# Patient Record
Sex: Female | Born: 1935 | Race: White | Hispanic: No | Smoking: Never smoker
Health system: Southern US, Community
[De-identification: ages and names within clinical notes are randomized; demographics above are authoritative.]

## PROBLEM LIST (undated history)

## (undated) DIAGNOSIS — E7800 Pure hypercholesterolemia, unspecified: Secondary | ICD-10-CM

## (undated) DIAGNOSIS — I10 Essential (primary) hypertension: Secondary | ICD-10-CM

## (undated) DIAGNOSIS — M1990 Unspecified osteoarthritis, unspecified site: Secondary | ICD-10-CM

## (undated) DIAGNOSIS — F419 Anxiety disorder, unspecified: Secondary | ICD-10-CM

## (undated) HISTORY — PX: CATARACT EXTRACTION, BILATERAL: SHX1313

## (undated) HISTORY — PX: TONSILLECTOMY: SUR1361

## (undated) HISTORY — PX: DIAGNOSTIC LAPAROSCOPY: SUR761

## (undated) HISTORY — PX: ABDOMINAL HYSTERECTOMY: SHX81

---

## 2003-03-21 HISTORY — PX: CERVICAL FUSION: SHX112

## 2005-01-13 ENCOUNTER — Ambulatory Visit (HOSPITAL_COMMUNITY): Admission: RE | Admit: 2005-01-13 | Discharge: 2005-01-13 | Payer: Self-pay | Admitting: Neurosurgery

## 2005-01-26 ENCOUNTER — Inpatient Hospital Stay (HOSPITAL_COMMUNITY): Admission: RE | Admit: 2005-01-26 | Discharge: 2005-01-27 | Payer: Self-pay | Admitting: Neurosurgery

## 2008-10-06 ENCOUNTER — Encounter: Admission: RE | Admit: 2008-10-06 | Discharge: 2008-10-06 | Payer: Self-pay | Admitting: Family Medicine

## 2008-10-23 ENCOUNTER — Encounter: Admission: RE | Admit: 2008-10-23 | Discharge: 2008-10-23 | Payer: Self-pay | Admitting: Family Medicine

## 2009-04-16 ENCOUNTER — Ambulatory Visit (HOSPITAL_COMMUNITY): Admission: RE | Admit: 2009-04-16 | Discharge: 2009-04-16 | Payer: Self-pay | Admitting: Gastroenterology

## 2009-05-13 ENCOUNTER — Emergency Department (HOSPITAL_BASED_OUTPATIENT_CLINIC_OR_DEPARTMENT_OTHER): Admission: EM | Admit: 2009-05-13 | Discharge: 2009-05-13 | Payer: Self-pay | Admitting: Emergency Medicine

## 2009-08-09 ENCOUNTER — Emergency Department (HOSPITAL_BASED_OUTPATIENT_CLINIC_OR_DEPARTMENT_OTHER): Admission: EM | Admit: 2009-08-09 | Discharge: 2009-08-09 | Payer: Self-pay | Admitting: Emergency Medicine

## 2009-08-10 ENCOUNTER — Ambulatory Visit: Payer: Self-pay | Admitting: Diagnostic Radiology

## 2009-08-10 ENCOUNTER — Emergency Department (HOSPITAL_BASED_OUTPATIENT_CLINIC_OR_DEPARTMENT_OTHER): Admission: EM | Admit: 2009-08-10 | Discharge: 2009-08-10 | Payer: Self-pay | Admitting: Emergency Medicine

## 2009-11-09 ENCOUNTER — Encounter: Admission: RE | Admit: 2009-11-09 | Discharge: 2009-11-09 | Payer: Self-pay | Admitting: Family Medicine

## 2009-11-30 ENCOUNTER — Emergency Department (HOSPITAL_BASED_OUTPATIENT_CLINIC_OR_DEPARTMENT_OTHER): Admission: EM | Admit: 2009-11-30 | Discharge: 2009-11-30 | Payer: Self-pay | Admitting: Emergency Medicine

## 2010-04-06 IMAGING — US US CAROTID DUPLEX BILAT
1 series · 13 of 24 positions shown · non-contrast
Comparison: None

CLINICAL DATA: Carotid bruit.  Hypertension.

BILATERAL CAROTID DUPLEX ULTRASOUND
TECHNIQUE: Gray scale imaging, color Doppler and duplex ultrasound
was performed of bilateral carotid and vertebral arteries in the
neck.

[Series 1: us carotid duplex bilat · 0.05mm/px · 13 of 69 slices shown]
[im 1/69]
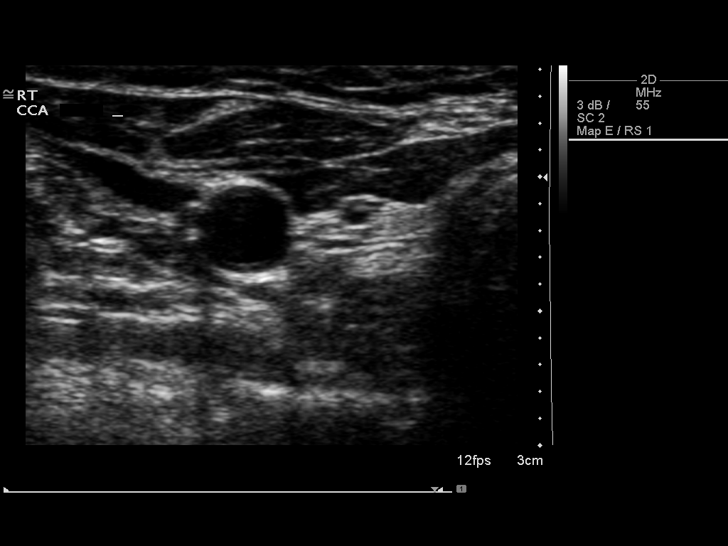
[im 6/69]
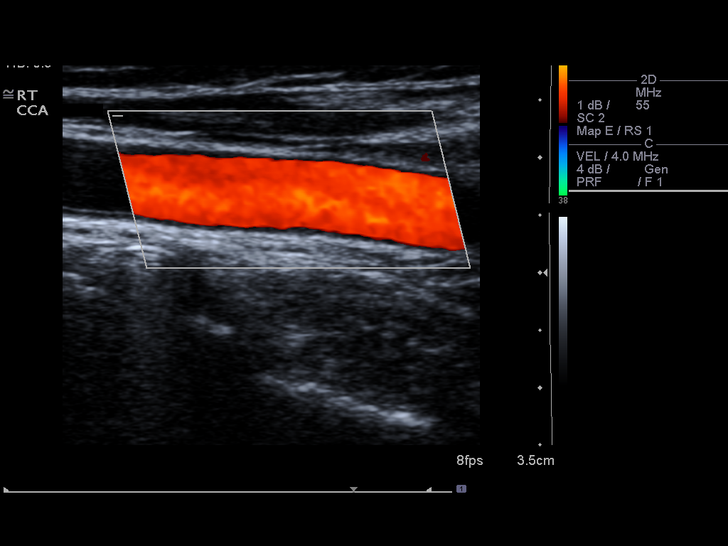
[im 12/69]
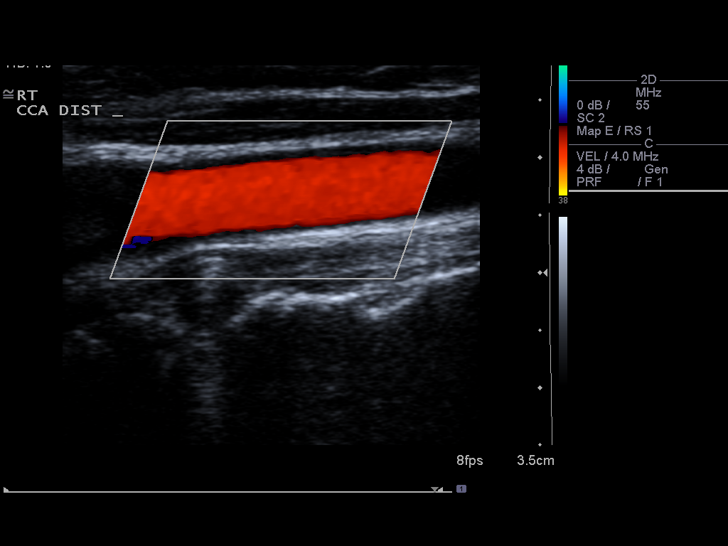
[im 18/69]
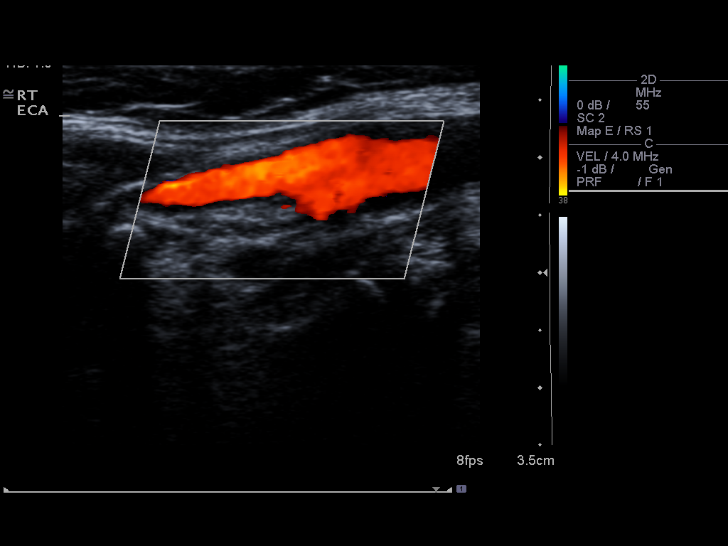
[im 24/69]
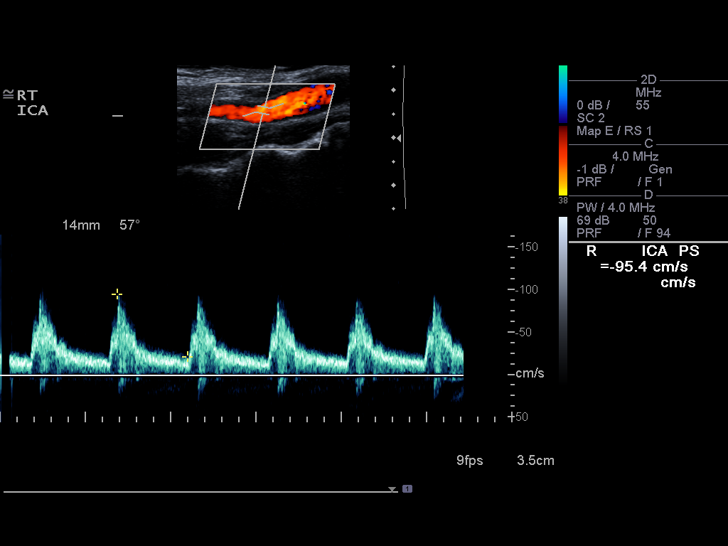
[im 30/69]
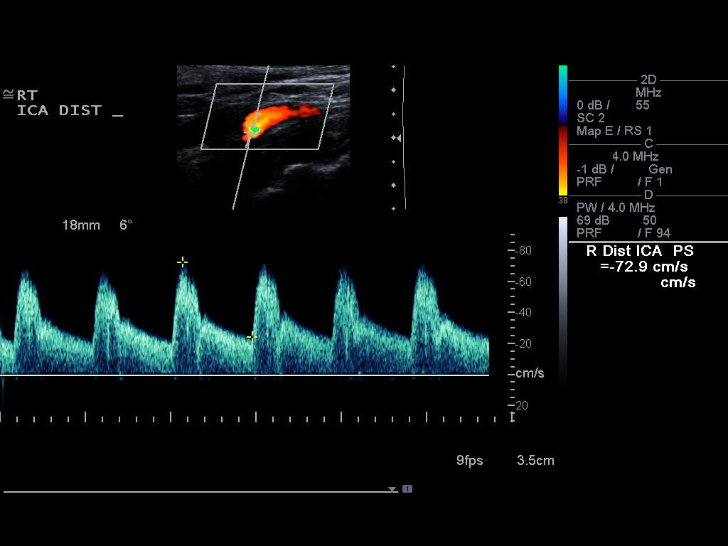
[im 36/69]
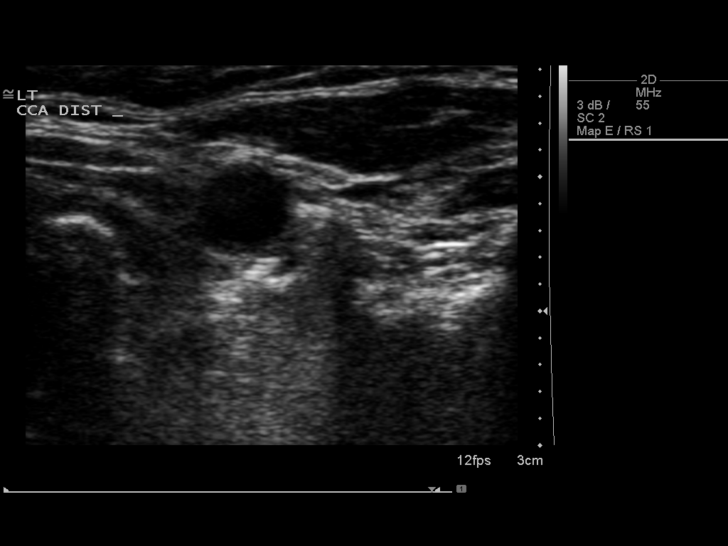
[im 39/69]
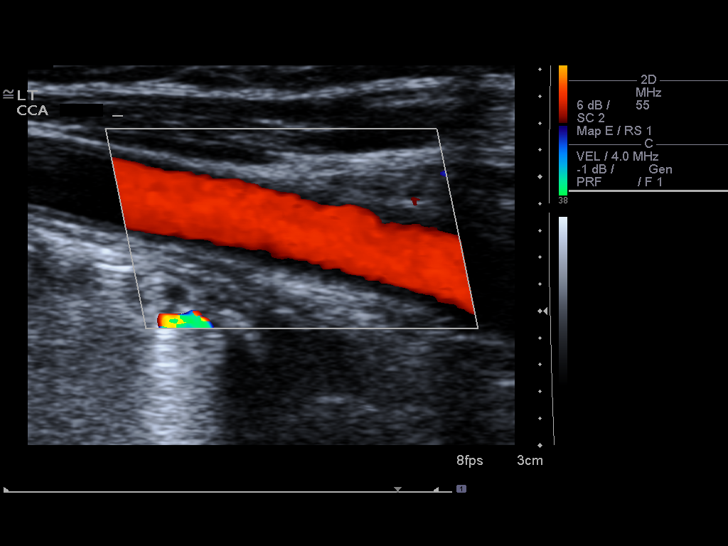
[im 45/69]
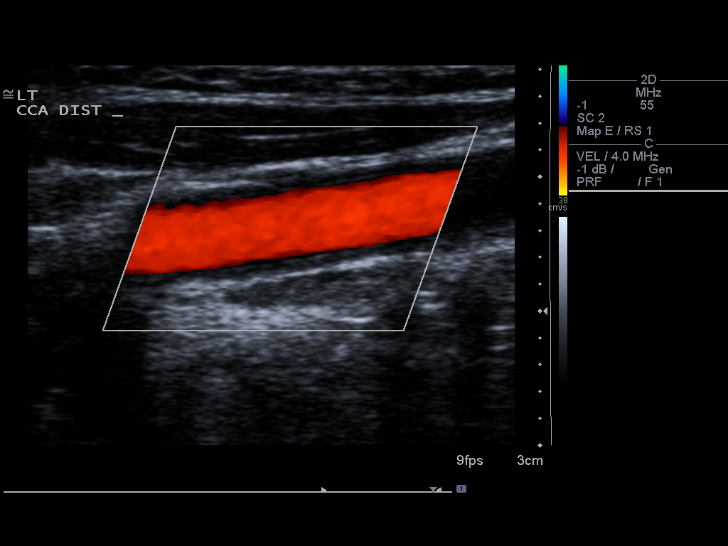
[im 51/69]
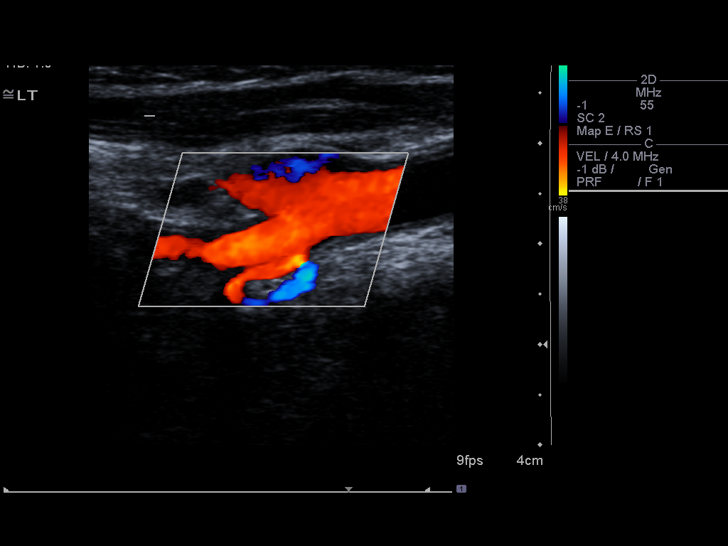
[im 57/69]
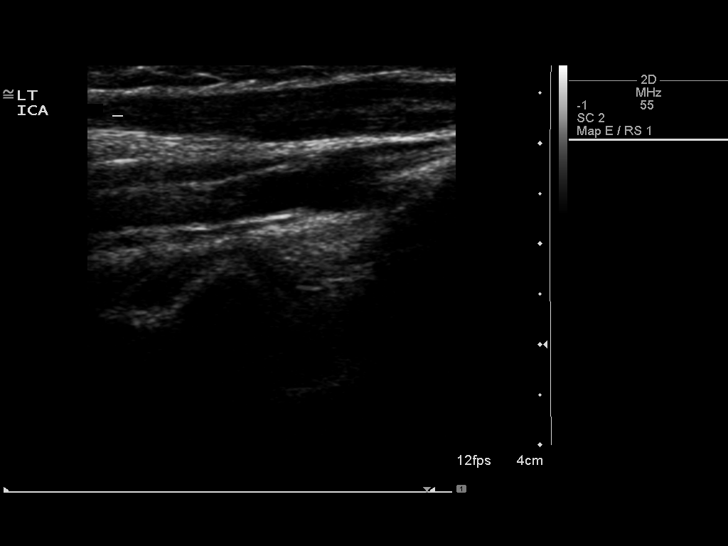
[im 63/69]
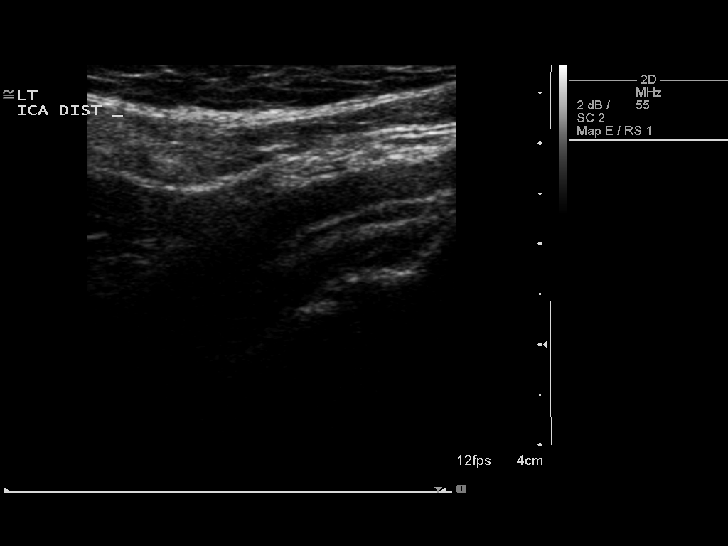
[im 69/69]
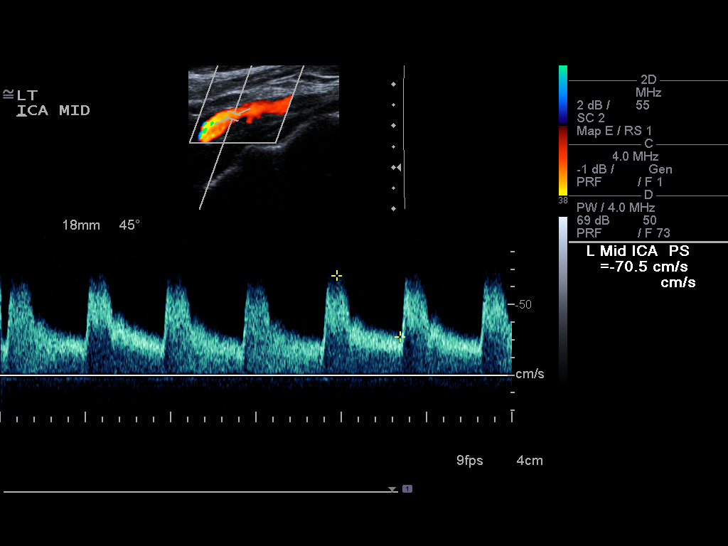

[13 of 24 positions shown; findings below may reference images not displayed]

Criteria:  Quantification of carotid stenosis is based on velocity
parameters that correlate the residual internal carotid diameter
with NASCET-based stenosis levels, using the diameter of the distal
internal carotid lumen as the denominator for stenosis measurement.

The following velocity measurements were obtained:

                 PEAK SYSTOLIC/END DIASTOLIC
RIGHT
ICA:                        95/22cm/sec
CCA:                        103/25cm/sec
SYSTOLIC ICA/CCA RATIO:
DIASTOLIC ICA/CCA RATIO:
ECA:                        105cm/sec

LEFT
ICA:                        73/27cm/sec
CCA:                        102/26cm/sec
SYSTOLIC ICA/CCA RATIO:
DIASTOLIC ICA/CCA RATIO:
ECA:                        75cm/sec
FINDINGS: RIGHT CAROTID ARTERY: There is mild intimal thickening in the bulb
without significant plaque accumulation or high-grade stenosis.
Normal wave forms with no focal aliasing on color Doppler
interrogation.

RIGHT VERTEBRAL ARTERY:  Normal flow direction and wave form.

LEFT CAROTID ARTERY: Smooth the early noncalcified plaque partially
effaces the carotid bulb.  No significant stenosis.  Normal wave
forms with no focal aliasing on color Doppler interrogation.

LEFT VERTEBRAL ARTERY:  Normal flow direction and wave form.
IMPRESSION: 1.  Early left carotid bulb plaque without significant stenosis.
The exam does not exclude plaque ulceration or embolization.
Continued surveillance recommended.

## 2010-04-09 ENCOUNTER — Encounter: Payer: Self-pay | Admitting: Neurosurgery

## 2010-04-10 ENCOUNTER — Encounter: Payer: Self-pay | Admitting: Gastroenterology

## 2010-06-02 LAB — CBC
HCT: 42.3 % (ref 36.0–46.0)
Hemoglobin: 14.1 g/dL (ref 12.0–15.0)
MCH: 31.9 pg (ref 26.0–34.0)
MCHC: 33.3 g/dL (ref 30.0–36.0)
MCV: 95.8 fL (ref 78.0–100.0)
Platelets: 235 10*3/uL (ref 150–400)
RBC: 4.41 MIL/uL (ref 3.87–5.11)
RDW: 12.5 % (ref 11.5–15.5)
WBC: 10.9 10*3/uL — ABNORMAL HIGH (ref 4.0–10.5)

## 2010-06-02 LAB — BASIC METABOLIC PANEL
BUN: 16 mg/dL (ref 6–23)
CO2: 28 mEq/L (ref 19–32)
Calcium: 9.7 mg/dL (ref 8.4–10.5)
Chloride: 103 mEq/L (ref 96–112)
Creatinine, Ser: 0.8 mg/dL (ref 0.4–1.2)
GFR calc Af Amer: >60 mL/min (ref >60)
GFR calc non Af Amer: >60 mL/min (ref >60)
Glucose, Bld: 87 mg/dL (ref 70–99)
Potassium: 4.5 mEq/L (ref 3.5–5.1)
Sodium: 143 mEq/L (ref 135–145)

## 2010-06-02 LAB — URINE MICROSCOPIC-ADD ON

## 2010-06-02 LAB — POCT CARDIAC MARKERS
CKMB, poc: <1 ng/mL — ABNORMAL LOW (ref 1.0–8.0)
Myoglobin, poc: 75.9 ng/mL (ref 12–200)
Troponin i, poc: <0.05 ng/mL (ref 0.00–0.09)

## 2010-06-02 LAB — URINALYSIS, ROUTINE W REFLEX MICROSCOPIC
Bilirubin Urine: NEGATIVE
Glucose, UA: NEGATIVE mg/dL
Hgb urine dipstick: NEGATIVE
Ketones, ur: NEGATIVE mg/dL
Nitrite: NEGATIVE
Protein, ur: NEGATIVE mg/dL
Specific Gravity, Urine: 1.01 (ref 1.005–1.030)
Urobilinogen, UA: 0.2 mg/dL (ref 0.0–1.0)
pH: 5.5 (ref 5.0–8.0)

## 2010-06-06 LAB — URINE CULTURE

## 2010-06-06 LAB — DIFFERENTIAL
Basophils Absolute: 0 10*3/uL (ref 0.0–0.1)
Basophils Relative: 0 % (ref 0–1)
Eosinophils Absolute: 0 10*3/uL (ref 0.0–0.7)
Eosinophils Relative: 0 % (ref 0–5)
Lymphocytes Relative: 16 % (ref 12–46)
Lymphs Abs: 1.2 10*3/uL (ref 0.7–4.0)
Monocytes Absolute: 0.5 10*3/uL (ref 0.1–1.0)
Monocytes Relative: 6 % (ref 3–12)
Neutro Abs: 5.8 10*3/uL (ref 1.7–7.7)
Neutrophils Relative %: 78 % — ABNORMAL HIGH (ref 43–77)

## 2010-06-06 LAB — COMPREHENSIVE METABOLIC PANEL
ALT: 34 U/L (ref 0–35)
AST: 30 U/L (ref 0–37)
Albumin: 3.9 g/dL (ref 3.5–5.2)
Alkaline Phosphatase: 53 U/L (ref 39–117)
BUN: 9 mg/dL (ref 6–23)
CO2: 28 mEq/L (ref 19–32)
Calcium: 9.1 mg/dL (ref 8.4–10.5)
Chloride: 105 mEq/L (ref 96–112)
Creatinine, Ser: 0.8 mg/dL (ref 0.4–1.2)
GFR calc Af Amer: >60 mL/min (ref >60)
GFR calc non Af Amer: >60 mL/min (ref >60)
Glucose, Bld: 106 mg/dL — ABNORMAL HIGH (ref 70–99)
Potassium: 4.1 mEq/L (ref 3.5–5.1)
Sodium: 143 mEq/L (ref 135–145)
Total Bilirubin: 0.7 mg/dL (ref 0.3–1.2)
Total Protein: 6.9 g/dL (ref 6.0–8.3)

## 2010-06-06 LAB — CBC
HCT: 37.8 % (ref 36.0–46.0)
Hemoglobin: 12.8 g/dL (ref 12.0–15.0)
MCHC: 33.8 g/dL (ref 30.0–36.0)
MCV: 96.2 fL (ref 78.0–100.0)
Platelets: 227 10*3/uL (ref 150–400)
RBC: 3.93 MIL/uL (ref 3.87–5.11)
RDW: 12.2 % (ref 11.5–15.5)
WBC: 7.5 10*3/uL (ref 4.0–10.5)

## 2010-06-06 LAB — URINALYSIS, ROUTINE W REFLEX MICROSCOPIC
Bilirubin Urine: NEGATIVE
Bilirubin Urine: NEGATIVE
Glucose, UA: NEGATIVE mg/dL
Glucose, UA: NEGATIVE mg/dL
Hgb urine dipstick: NEGATIVE
Hgb urine dipstick: NEGATIVE
Ketones, ur: NEGATIVE mg/dL
Ketones, ur: NEGATIVE mg/dL
Nitrite: NEGATIVE
Nitrite: NEGATIVE
Protein, ur: NEGATIVE mg/dL
Protein, ur: NEGATIVE mg/dL
Specific Gravity, Urine: 1.009 (ref 1.005–1.030)
Specific Gravity, Urine: 1.014 (ref 1.005–1.030)
Urobilinogen, UA: 0.2 mg/dL (ref 0.0–1.0)
Urobilinogen, UA: 0.2 mg/dL (ref 0.0–1.0)
pH: 5.5 (ref 5.0–8.0)
pH: 5.5 (ref 5.0–8.0)

## 2010-06-06 LAB — LIPASE, BLOOD: Lipase: 73 U/L (ref 23–300)

## 2010-06-06 LAB — URINE MICROSCOPIC-ADD ON

## 2010-06-10 LAB — CBC
HCT: 39.3 % (ref 36.0–46.0)
MCV: 94.8 fL (ref 78.0–100.0)
Platelets: 268 10*3/uL (ref 150–400)
RDW: 11.1 % — ABNORMAL LOW (ref 11.5–15.5)

## 2010-06-10 LAB — URINALYSIS, ROUTINE W REFLEX MICROSCOPIC
Ketones, ur: NEGATIVE mg/dL
Nitrite: NEGATIVE
Protein, ur: NEGATIVE mg/dL

## 2010-06-10 LAB — BASIC METABOLIC PANEL
BUN: 6 mg/dL (ref 6–23)
Chloride: 91 mEq/L — ABNORMAL LOW (ref 96–112)
GFR calc non Af Amer: >60 mL/min (ref >60)
Glucose, Bld: 84 mg/dL (ref 70–99)
Potassium: 3.7 mEq/L (ref 3.5–5.1)

## 2010-06-10 LAB — DIFFERENTIAL
Basophils Absolute: 0 10*3/uL (ref 0.0–0.1)
Eosinophils Absolute: 0 10*3/uL (ref 0.0–0.7)
Eosinophils Relative: 0 % (ref 0–5)

## 2010-08-05 NOTE — Op Note (Signed)
NAMEKENDELL, GAMMON                 ACCOUNT NO.:  [id number]   MEDICAL RECORD NO.:  [id number]          PATIENT TYPE:  OIB   LOCATION:  3013                         FACILITY:  [facility]   PHYSICIAN:  Hewitt Shorts, M.D.DATE OF BIRTH:  1934-05-20   DATE OF PROCEDURE:  01/26/2005  DATE OF DISCHARGE:                                 OPERATIVE REPORT   PREOPERATIVE DIAGNOSES:  1.  C5-6, C6-7 cervical disk herniation.  2.  Cervical degenerative disk disease.  3.  Cervical spondylosis.  4.  Cervical radiculopathy.   POSTOPERATIVE DIAGNOSES:  1.  C5-6, C6-7 cervical disk herniation.  2.  Cervical degenerative disk disease.  3.  Cervical spondylosis.  4.  Cervical radiculopathy.   OPERATION/PROCEDURE:  C5-6, C6-7 anterior cervical diskectomy with  arthrodesis with allograft and Tether cervical plating.   SURGEON:  Hewitt Shorts, M.D.   ASSISTANT:  Cristi Loron, M.D.   ANESTHESIA:  General endotracheal anesthesia.   INDICATIONS:  The patient is 76 year old woman who presented with an acute  right cervical radiculopathy.  Was found to be due to a large right C6-7  cervical disk connection.  However, she had significant spondylitic disk  herniation and degenerative disk disease at the C5-6 and C6-7 levels, and  thus, we may proceed with a two-level anterior cervical diskectomy with  arthrodesis.   DESCRIPTION OF PROCEDURE:  The patient was brought to the operating room and  placed under general endotracheal anesthesia.  The patient was placed in 10  pounds of halter traction.  Neck was prepped with Betadine soap and  solution, draped in the sterile fashion.  A horizontal incision was made in  the left side of the neck.  The line of the incision was infiltrated with  local anesthetic with epinephrine.  The incision was carried down through  the subcutaneous tissue and platysma.  Bipolar cautery and electrocautery  was used to maintain hemostasis.  The incision was carried  down through an  avascular plane, leaving the sternocleidomastoid, carotid artery, and  jugular vein laterally with the trachea and esophagus medially.  The ventral  aspect and the vertebral column identified and a localizing x-ray taken, and  the C5-6 and C6-7 intervertebral disk space was identified.  Diskectomy was  begun with an incision at the annulus at each level using continuous  microcurets and pituitary rongeurs.  Anterior osteophytic overgrowth was  removed, and the microscope was draped and brought into the field to provide  additional magnification, illumination, and visualization.  The remainder of  the decompression was performed using microdissection and microsurgical  technique.  The cauterized end plates and the corresponding vertebrae were  removed using the microcurets and X-Max drill.  Posterior osseous overgrowth  was removed using the X-Max drill and a 2 mm Kerrison punch with a thin foot  plate.  There was significant spondylytic degenerative changes of the  posterior longitudinal ligament, which was carefully removed, and as we  dissected laterally towards the right C6-7 neural foramen, several fragments  of disk herniation were exposed, freed up from the surrounding epidural  tissues  and removed.  Decompression the lateral thecal sac and exiting right  C7 nerve root.  In the end, good decompression was achieved at the spinal  canal and thecal sac as well as the neural foramen and nerve roots  bilaterally at each level.  Once the decompression was completed, hemostasis  was established with the use of Gelfoam soaked in thrombin and then we  measured the height of each intervertebral disk space and selected two 6 mm  in height cortical allografts.  The center of each was filled with DBX putty  and each graft was implanted in the intervertebral disk space and  countersunk.  We then discontinued the cervical traction and selected a 32  mm Tether cervical plate.  This  was positioned over the fusion construct.  It was secured to the vertebra with 4 x 14 mm screws, placing two screws at  C5, two screws at C7 and a single screw at C6.  The screw hole was drilled  and tapped.  The screws were placed in an alternating fashion.  Final  tightening was that of all five screws.  X-ray afterwards showed implants  including screws, plate and bone grafts in good position.  The overall  alignment was good.  The wound was irrigated with bacitracin solution and  checked for hemostasis, established and confirmed.  Then, we proceeded with  closure of the platysma and closed with interrupted, inverted 2-0 Vicryl  sutures; the subcutaneous and subcuticular were closed with interrupted,  inverted 3-0 Vicryl; and the skin was reapproximated with Dermabond.  The  procedure was tolerated well.  Estimated blood loss was 35 mL.  Sponge count  correct.  Following surgery, the patient was placed in a soft cervical  collar to be reversed, extubated and transferred to the recovery room for  further care.      Hewitt Shorts, M.D.  Electronically Signed     RWN/MEDQ  D:  01/26/2005  T:  01/27/2005  Job:  [id number]

## 2010-10-27 ENCOUNTER — Emergency Department (HOSPITAL_BASED_OUTPATIENT_CLINIC_OR_DEPARTMENT_OTHER)
Admission: EM | Admit: 2010-10-27 | Discharge: 2010-10-28 | Disposition: A | Discharge status: Home / Self Care | Payer: Medicare Other | Attending: Emergency Medicine | Admitting: Emergency Medicine

## 2010-10-27 DIAGNOSIS — K5289 Other specified noninfective gastroenteritis and colitis: Secondary | ICD-10-CM | POA: Insufficient documentation

## 2010-10-27 DIAGNOSIS — R197 Diarrhea, unspecified: Secondary | ICD-10-CM | POA: Insufficient documentation

## 2010-10-27 DIAGNOSIS — I10 Essential (primary) hypertension: Secondary | ICD-10-CM | POA: Insufficient documentation

## 2010-10-27 DIAGNOSIS — E7800 Pure hypercholesterolemia, unspecified: Secondary | ICD-10-CM | POA: Insufficient documentation

## 2010-10-27 HISTORY — DX: Essential (primary) hypertension: I10

## 2010-10-27 HISTORY — DX: Pure hypercholesterolemia, unspecified: E78.00

## 2010-10-27 NOTE — ED Notes (Signed)
Diarrhea, sweats, nausea since 5pm

## 2010-10-28 LAB — URINALYSIS, ROUTINE W REFLEX MICROSCOPIC
Bilirubin Urine: NEGATIVE
Ketones, ur: NEGATIVE mg/dL
Nitrite: NEGATIVE
Urobilinogen, UA: 0.2 mg/dL (ref 0.0–1.0)

## 2010-10-28 LAB — URINE MICROSCOPIC-ADD ON

## 2010-10-28 MED ORDER — SODIUM CHLORIDE 0.9 % IV BOLUS (SEPSIS)
1000.0000 mL | Freq: Once | INTRAVENOUS | Status: AC
  Administered 2010-10-28: 1000 mL via INTRAVENOUS

## 2010-10-28 MED ORDER — ONDANSETRON HCL 4 MG/2ML IJ SOLN
4.0000 mg | Freq: Once | INTRAMUSCULAR | Status: AC
  Administered 2010-10-28: 4 mg via INTRAVENOUS
  Filled 2010-10-28: qty 2

## 2010-10-28 MED ORDER — ONDANSETRON HCL 4 MG PO TABS: 4.0000 mg | ORAL_TABLET | Freq: Three times a day (TID) | ORAL | Status: AC | PRN

## 2010-10-28 NOTE — ED Provider Notes (Signed)
History     CSN: [id number] Arrival date & time: 10/27/2010 10:37 PM  Chief Complaint  Patient presents with  . Diarrhea   HPI This is a 76 year old female with a history of nausea and diarrhea that began yesterday afternoon about 5:30. She estimates she's had 6 diarrheal stools. She has not had vomiting. She states she feels like she is dehydrated and her mouth is dry. She has taken Imodium with improvement in the diarrhea. The nausea persists and is or principal concern at this time. There's been no associated chest pain dyspnea or abdominal pain although she has had some abdominal cramping. She denies dysuria.  Past Medical History  Diagnosis Date  . High cholesterol   . Hypertension     Past Surgical History  Procedure Date  . Abdominal hysterectomy     No family history on file.  History  Substance Use Topics  . Smoking status: Never Smoker   . Smokeless tobacco: Not on file  . Alcohol Use: Yes    OB History    Grav Para Term Preterm Abortions TAB SAB Ect Mult Living                  Review of Systems  All other systems reviewed and are negative.    Physical Exam  BP 121/62  Pulse 75  Temp(Src) 97.6 F (36.4 C) (Oral)  Resp 16  Ht 5\' 3"  (1.6 m)  Wt 112 lb (50.803 kg)  BMI 19.84 kg/m2  SpO2 94%  Physical Exam General: Thin white female in no acute distress; appearance consistent with age of record HENT: normocephalic, atraumatic Eyes: pupils equal round and reactive to light; extraocular muscles intact Neck: supple Heart: regular rate and rhythm; no murmurs, rubs or gallops Lungs: clear to auscultation bilaterally Abdomen: soft; nontender; nondistended; no masses or hepatosplenomegaly; bowel sounds present Extremities: No deformity; full range of motion; pulses normal Neurologic: Awake, alert and oriented;motor function intact in all extremities and symmetric;sensation grossly intact; no facial droop Skin: Warm and dry Psychiatric: Normal mood and  affect   ED Course  Procedures  MDM Nursing notes and vitals signs, including pulse oximetry, reviewed.  Summary of this visit's results, reviewed by myself:  Labs:  Results for orders placed during the hospital encounter of 10/27/10  URINALYSIS, ROUTINE W REFLEX MICROSCOPIC      Component Value Range   Color, Urine YELLOW  YELLOW    Appearance CLEAR  CLEAR    Specific Gravity, Urine 1.021  1.005 - 1.030    pH 5.0  5.0 - 8.0    Glucose, UA NEGATIVE  NEGATIVE (mg/dL)   Hgb urine dipstick TRACE (*) NEGATIVE    Bilirubin Urine NEGATIVE  NEGATIVE    Ketones, ur NEGATIVE  NEGATIVE (mg/dL)   Protein, ur NEGATIVE  NEGATIVE (mg/dL)   Urobilinogen, UA 0.2  0.0 - 1.0 (mg/dL)   Nitrite NEGATIVE  NEGATIVE    Leukocytes, UA TRACE (*) NEGATIVE   URINE MICROSCOPIC-ADD ON      Component Value Range   Squamous Epithelial / LPF FEW (*) RARE    WBC, UA 3-6  <3 (WBC/hpf)   RBC / HPF 3-6  <3 (RBC/hpf)   Casts HYALINE CASTS (*) NEGATIVE     Imaging Studies: No results found.  2:23 AM Patient feels better after IV fluid bolus and Zofran. She states her nausea is relieved. She was advised to continue the Imodium for diarrhea as needed per package instructions.  Hanley Seamen, MD 10/28/10 [phone]

## 2010-10-28 NOTE — ED Notes (Signed)
Pt lying in bed sleeping but rouses to voice. Pt is in NAD. Pt has had no episodes of diarrhea while in the department but continues to c/o of nausea. Pt denies needs and verbalizes understanding of wait to see provider.

## 2010-11-22 ENCOUNTER — Other Ambulatory Visit: Payer: Self-pay | Admitting: Family Medicine

## 2010-11-22 DIAGNOSIS — Z1231 Encounter for screening mammogram for malignant neoplasm of breast: Secondary | ICD-10-CM

## 2010-12-14 ENCOUNTER — Ambulatory Visit: Payer: Medicare Other

## 2010-12-30 ENCOUNTER — Ambulatory Visit
Admission: RE | Admit: 2010-12-30 | Discharge: 2010-12-30 | Disposition: A | Discharge status: Home / Self Care | Payer: Medicare Other | Source: Ambulatory Visit | Attending: Family Medicine | Admitting: Family Medicine

## 2010-12-30 DIAGNOSIS — Z1231 Encounter for screening mammogram for malignant neoplasm of breast: Secondary | ICD-10-CM

## 2011-04-18 ENCOUNTER — Other Ambulatory Visit: Payer: Self-pay | Admitting: Orthopedic Surgery

## 2011-04-20 ENCOUNTER — Other Ambulatory Visit: Payer: Self-pay

## 2011-04-20 ENCOUNTER — Encounter (HOSPITAL_BASED_OUTPATIENT_CLINIC_OR_DEPARTMENT_OTHER)
Admission: RE | Admit: 2011-04-20 | Discharge: 2011-04-20 | Disposition: A | Discharge status: Home / Self Care | Payer: Medicare Other | Source: Ambulatory Visit | Attending: Orthopedic Surgery | Admitting: Orthopedic Surgery

## 2011-04-20 ENCOUNTER — Encounter (HOSPITAL_BASED_OUTPATIENT_CLINIC_OR_DEPARTMENT_OTHER): Payer: Self-pay | Admitting: *Deleted

## 2011-04-20 LAB — BASIC METABOLIC PANEL
BUN: 20 mg/dL (ref 6–23)
Chloride: 101 mEq/L (ref 96–112)
GFR calc Af Amer: 81 mL/min — ABNORMAL LOW (ref >90)
Glucose, Bld: 88 mg/dL (ref 70–99)
Potassium: 4.2 mEq/L (ref 3.5–5.1)

## 2011-04-20 NOTE — Progress Notes (Signed)
To come in for [id number]  

## 2011-04-25 ENCOUNTER — Ambulatory Visit (HOSPITAL_BASED_OUTPATIENT_CLINIC_OR_DEPARTMENT_OTHER)
Admission: RE | Admit: 2011-04-25 | Discharge: 2011-04-25 | Disposition: A | Discharge status: Home / Self Care | Payer: Medicare Other | Source: Ambulatory Visit | Attending: Orthopedic Surgery | Admitting: Orthopedic Surgery

## 2011-04-25 ENCOUNTER — Encounter (HOSPITAL_BASED_OUTPATIENT_CLINIC_OR_DEPARTMENT_OTHER): Payer: Self-pay | Admitting: *Deleted

## 2011-04-25 ENCOUNTER — Encounter (HOSPITAL_BASED_OUTPATIENT_CLINIC_OR_DEPARTMENT_OTHER): Payer: Self-pay | Admitting: Orthopedic Surgery

## 2011-04-25 ENCOUNTER — Encounter (HOSPITAL_BASED_OUTPATIENT_CLINIC_OR_DEPARTMENT_OTHER): Payer: Self-pay | Admitting: Certified Registered Nurse Anesthetist

## 2011-04-25 ENCOUNTER — Encounter (HOSPITAL_BASED_OUTPATIENT_CLINIC_OR_DEPARTMENT_OTHER)
Admission: RE | Disposition: A | Discharge status: Home / Self Care | Payer: Self-pay | Source: Ambulatory Visit | Attending: Orthopedic Surgery

## 2011-04-25 DIAGNOSIS — Z01810 Encounter for preprocedural cardiovascular examination: Secondary | ICD-10-CM | POA: Insufficient documentation

## 2011-04-25 DIAGNOSIS — M19049 Primary osteoarthritis, unspecified hand: Secondary | ICD-10-CM | POA: Insufficient documentation

## 2011-04-25 DIAGNOSIS — Z01812 Encounter for preprocedural laboratory examination: Secondary | ICD-10-CM | POA: Insufficient documentation

## 2011-04-25 DIAGNOSIS — E7800 Pure hypercholesterolemia, unspecified: Secondary | ICD-10-CM | POA: Insufficient documentation

## 2011-04-25 DIAGNOSIS — I10 Essential (primary) hypertension: Secondary | ICD-10-CM | POA: Insufficient documentation

## 2011-04-25 HISTORY — DX: Unspecified osteoarthritis, unspecified site: M19.90

## 2011-04-25 HISTORY — DX: Anxiety disorder, unspecified: F41.9

## 2011-04-25 HISTORY — PX: FINGER ARTHRODESIS: SHX5000

## 2011-04-25 LAB — POCT HEMOGLOBIN-HEMACUE: Hemoglobin: 13.1 g/dL (ref 12.0–15.0)

## 2011-04-25 SURGERY — FUSION, JOINT, FINGER
Anesthesia: General | Site: Finger | Laterality: Right | Wound class: Clean

## 2011-04-25 MED ORDER — PROMETHAZINE HCL 25 MG/ML IJ SOLN: 6.2500 mg | INTRAMUSCULAR | Status: DC | PRN

## 2011-04-25 MED ORDER — OXYCODONE-ACETAMINOPHEN 5-325 MG PO TABS: 1.0000 | ORAL_TABLET | ORAL | Status: AC | PRN

## 2011-04-25 MED ORDER — LIDOCAINE HCL (CARDIAC) 20 MG/ML IV SOLN
INTRAVENOUS | Status: DC | PRN
  Administered 2011-04-25: 60 mg via INTRAVENOUS

## 2011-04-25 MED ORDER — CHLORHEXIDINE GLUCONATE 4 % EX LIQD: 60.0000 mL | Freq: Once | CUTANEOUS | Status: DC

## 2011-04-25 MED ORDER — CEFAZOLIN SODIUM 1-5 GM-% IV SOLN
INTRAVENOUS | Status: DC | PRN
  Administered 2011-04-25: 1 g via INTRAVENOUS

## 2011-04-25 MED ORDER — PROPOFOL 10 MG/ML IV EMUL
INTRAVENOUS | Status: DC | PRN
  Administered 2011-04-25: 100 mg via INTRAVENOUS

## 2011-04-25 MED ORDER — CEFAZOLIN SODIUM 1-5 GM-% IV SOLN: 1.0000 g | Freq: Once | INTRAVENOUS | Status: DC

## 2011-04-25 MED ORDER — EPHEDRINE SULFATE 50 MG/ML IJ SOLN
INTRAMUSCULAR | Status: DC | PRN
  Administered 2011-04-25: 10 mg via INTRAVENOUS

## 2011-04-25 MED ORDER — DEXAMETHASONE SODIUM PHOSPHATE 10 MG/ML IJ SOLN
INTRAMUSCULAR | Status: DC | PRN
  Administered 2011-04-25: 10 mg via INTRAVENOUS

## 2011-04-25 MED ORDER — LACTATED RINGERS IV SOLN
INTRAVENOUS | Status: DC
  Administered 2011-04-25 (×2): via INTRAVENOUS

## 2011-04-25 MED ORDER — FENTANYL CITRATE 0.05 MG/ML IJ SOLN
INTRAMUSCULAR | Status: DC | PRN
  Administered 2011-04-25: 25 ug via INTRAVENOUS
  Administered 2011-04-25: 50 ug via INTRAVENOUS

## 2011-04-25 MED ORDER — LIDOCAINE HCL 2 % IJ SOLN
INTRAMUSCULAR | Status: DC | PRN
  Administered 2011-04-25: 4.5 mL

## 2011-04-25 MED ORDER — CEPHALEXIN 500 MG PO CAPS: 500.0000 mg | ORAL_CAPSULE | Freq: Three times a day (TID) | ORAL | Status: AC

## 2011-04-25 MED ORDER — ONDANSETRON HCL 4 MG/2ML IJ SOLN
INTRAMUSCULAR | Status: DC | PRN
  Administered 2011-04-25: 4 mg via INTRAVENOUS

## 2011-04-25 MED ORDER — MEPERIDINE HCL 25 MG/ML IJ SOLN: 6.2500 mg | INTRAMUSCULAR | Status: DC | PRN

## 2011-04-25 MED ORDER — FENTANYL CITRATE 0.05 MG/ML IJ SOLN: 25.0000 ug | INTRAMUSCULAR | Status: DC | PRN

## 2011-04-25 SURGICAL SUPPLY — 78 items
BANDAGE ADHESIVE 1X3 (GAUZE/BANDAGES/DRESSINGS) IMPLANT
BANDAGE CONFORM 3  STR LF (GAUZE/BANDAGES/DRESSINGS) IMPLANT
BANDAGE ELASTIC 3 VELCRO ST LF (GAUZE/BANDAGES/DRESSINGS) ×1 IMPLANT
BANDAGE GAUZE ELAST BULKY 4 IN (GAUZE/BANDAGES/DRESSINGS) IMPLANT
BIT DRILL MICR ACTRK 2 LNG PRF (BIT) IMPLANT
BLADE MINI RND TIP GREEN BEAV (BLADE) ×1 IMPLANT
BLADE SURG 15 STRL LF DISP TIS (BLADE) ×1 IMPLANT
BLADE SURG 15 STRL SS (BLADE) ×2
BNDG CMPR 9X4 STRL LF SNTH (GAUZE/BANDAGES/DRESSINGS) ×1
BNDG CMPR MD 5X2 ELC HKLP STRL (GAUZE/BANDAGES/DRESSINGS)
BNDG COHESIVE 1X5 TAN STRL LF (GAUZE/BANDAGES/DRESSINGS) ×1 IMPLANT
BNDG ELASTIC 2 VLCR STRL LF (GAUZE/BANDAGES/DRESSINGS) IMPLANT
BNDG ESMARK 4X9 LF (GAUZE/BANDAGES/DRESSINGS) ×2 IMPLANT
BRUSH SCRUB EZ PLAIN DRY (MISCELLANEOUS) ×2 IMPLANT
BUR EGG/OVAL CARBIDE (BURR) ×1 IMPLANT
BUR FAST CUTTING MED (BURR) IMPLANT
CLOTH BEACON ORANGE TIMEOUT ST (SAFETY) ×2 IMPLANT
CORDS BIPOLAR (ELECTRODE) ×2 IMPLANT
COVER MAYO STAND STRL (DRAPES) ×2 IMPLANT
COVER TABLE BACK 60X90 (DRAPES) ×2 IMPLANT
CUFF TOURNIQUET SINGLE 18IN (TOURNIQUET CUFF) ×1 IMPLANT
DECANTER SPIKE VIAL GLASS SM (MISCELLANEOUS) ×1 IMPLANT
DRAPE EXTREMITY T 121X128X90 (DRAPE) ×2 IMPLANT
DRAPE OEC MINIVIEW 54X84 (DRAPES) ×2 IMPLANT
DRAPE SURG 17X23 STRL (DRAPES) ×2 IMPLANT
DRILL MICRO ACUTRAK 2 LNG PROF (BIT) ×2
DRSG TEGADERM 4X4.75 (GAUZE/BANDAGES/DRESSINGS) IMPLANT
GAUZE XEROFORM 1X8 LF (GAUZE/BANDAGES/DRESSINGS) IMPLANT
GLOVE BIO SURGEON STRL SZ7 (GLOVE) ×1 IMPLANT
GLOVE BIOGEL M STRL SZ7.5 (GLOVE) ×2 IMPLANT
GLOVE BIOGEL PI IND STRL 7.0 (GLOVE) IMPLANT
GLOVE BIOGEL PI IND STRL 8 (GLOVE) ×1 IMPLANT
GLOVE BIOGEL PI INDICATOR 7.0 (GLOVE) ×2
GLOVE BIOGEL PI INDICATOR 8 (GLOVE) ×1
GLOVE ORTHO TXT STRL SZ7.5 (GLOVE) ×2 IMPLANT
GOWN PREVENTION PLUS XLARGE (GOWN DISPOSABLE) ×2 IMPLANT
GOWN PREVENTION PLUS XXLARGE (GOWN DISPOSABLE) ×4 IMPLANT
K-WIRE 4.0X.028 (WIRE) IMPLANT
KWIRE 4.0 X .035IN (WIRE) ×2 IMPLANT
LOOP VESSEL MAXI BLUE (MISCELLANEOUS) IMPLANT
NDL HYPO 25X1 1.5 SAFETY (NEEDLE) IMPLANT
NEEDLE 27GAX1X1/2 (NEEDLE) ×1 IMPLANT
NEEDLE HYPO 25X1 1.5 SAFETY (NEEDLE) IMPLANT
NS IRRIG 1000ML POUR BTL (IV SOLUTION) ×2 IMPLANT
PACK BASIN DAY SURGERY FS (CUSTOM PROCEDURE TRAY) ×2 IMPLANT
PAD CAST 3X4 CTTN HI CHSV (CAST SUPPLIES) IMPLANT
PADDING CAST ABS 4INX4YD NS (CAST SUPPLIES) ×1
PADDING CAST ABS COTTON 4X4 ST (CAST SUPPLIES) ×1 IMPLANT
PADDING CAST COTTON 3X4 STRL (CAST SUPPLIES) ×2
PADDING UNDERCAST 2  STERILE (CAST SUPPLIES) ×2 IMPLANT
SCREW ACUTRAK 2 MICRO 20MM (Screw) ×1 IMPLANT
SLEEVE SCD COMPRESS KNEE MED (MISCELLANEOUS) ×1 IMPLANT
SPLINT FNGR BALL END 5/8X4.25 (SOFTGOODS) IMPLANT
SPLINT PLASTALUME BALL 4 1/4IN (SOFTGOODS) ×2
SPLINT PLASTER CAST XFAST 3X15 (CAST SUPPLIES) IMPLANT
SPLINT PLASTER XTRA FASTSET 3X (CAST SUPPLIES)
SPONGE GAUZE 4X4 12PLY (GAUZE/BANDAGES/DRESSINGS) ×2 IMPLANT
STOCKINETTE 4X48 STRL (DRAPES) ×2 IMPLANT
STRIP CLOSURE SKIN 1/2X4 (GAUZE/BANDAGES/DRESSINGS) IMPLANT
SUT ETHILON 5 0 P 3 18 (SUTURE)
SUT FIBERWIRE 3-0 18 TAPR NDL (SUTURE)
SUT MERSILENE 4 0 P 3 (SUTURE) ×1 IMPLANT
SUT NYLON ETHILON 5-0 P-3 1X18 (SUTURE) IMPLANT
SUT PROLENE 3 0 PS 2 (SUTURE) ×1 IMPLANT
SUT PROLENE 4 0 P 3 18 (SUTURE) ×1 IMPLANT
SUT STEEL 0 (SUTURE)
SUT STEEL 0 18XMFL TIE 17 (SUTURE) IMPLANT
SUT VIC AB 4-0 P-3 18XBRD (SUTURE) IMPLANT
SUT VIC AB 4-0 P3 18 (SUTURE)
SUTURE FIBERWR 3-0 18 TAPR NDL (SUTURE) IMPLANT
SYR 3ML 23GX1 SAFETY (SYRINGE) IMPLANT
SYR BULB 3OZ (MISCELLANEOUS) ×2 IMPLANT
SYR CONTROL 10ML LL (SYRINGE) ×2 IMPLANT
TOWEL OR 17X24 6PK STRL BLUE (TOWEL DISPOSABLE) ×3 IMPLANT
TOWEL OR NON WOVEN STRL DISP B (DISPOSABLE) ×1 IMPLANT
TRAY DSU PREP LF (CUSTOM PROCEDURE TRAY) ×2 IMPLANT
UNDERPAD 30X30 INCONTINENT (UNDERPADS AND DIAPERS) ×2 IMPLANT
WATER STERILE IRR 1000ML POUR (IV SOLUTION) ×2 IMPLANT

## 2011-04-25 NOTE — Anesthesia Postprocedure Evaluation (Signed)
  Anesthesia Post-op Note  Patient: Melinda Walker  Procedure(s) Performed:  ARTHRODESIS FINGER - Fusion right index distal interphalangeal  Patient Location: PACU  Anesthesia Type: General  Level of Consciousness: awake, alert  and oriented  Airway and Oxygen Therapy: Patient Spontanous Breathing  Post-op Pain: none  Post-op Assessment: Post-op Vital signs reviewed, Patient's Cardiovascular Status Stable, Respiratory Function Stable, Patent Airway and No signs of Nausea or vomiting  Post-op Vital Signs: Reviewed and stable  Complications: No apparent anesthesia complications

## 2011-04-25 NOTE — Brief Op Note (Signed)
04/25/2011  2:48 PM  PATIENT:  Melinda Walker  77 y.o. female  PRE-OPERATIVE DIAGNOSIS:  Unstable degenerative joint disease right index distal interphalangeal joint  POST-OPERATIVE DIAGNOSIS:  Unstable degenerative joint disease right index distal interphalangeal joint  PROCEDURE:  Procedure(s): ARTHRODESIS RIGHT INDEX FINGER DISTAL INTERPHALANGEAL JOINT WITH ACUTRAK SCREW FIXATION  SURGEON:  Surgeon(s): Wyn Forster., MD  PHYSICIAN ASSISTANT:   ASSISTANTS: Mallory Shirk.A-C   ANESTHESIA:   general  EBL:  Total I/O In: 1000 [I.V.:1000] Out: -   BLOOD ADMINISTERED:none  DRAINS: none   LOCAL MEDICATIONS USED:  XYLOCAINE 4 CC 2%  SPECIMEN:  No Specimen  DISPOSITION OF SPECIMEN:  N/A  COUNTS:  YES  TOURNIQUET:  * Missing tourniquet times found for documented tourniquets in log:  19616 *  DICTATION: .Other Dictation: Dictation Number [id number]  PLAN OF CARE: Discharge to home after PACU  PATIENT DISPOSITION:  PACU - hemodynamically stable.

## 2011-04-25 NOTE — H&P (Signed)
Melinda Walker is an 77 y.o. female.   Chief Complaint: painful and unstable right index finger distal interphalangeal joint. 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  She has had osteoarthritis for years.  She now presents for evaluation of her unstable and painful right index finger DIP joint which has very profound osteoarthritis with a 40 degree radial deviation deformity and malrotation  Past Medical History  Diagnosis Date  . High cholesterol   . Hypertension   . Arthritis   . Anxiety     Past Surgical History  Procedure Date  . Abdominal hysterectomy   . Diagnostic laparoscopy   . Cervical fusion 2005  . Tonsillectomy     History reviewed. No pertinent family history. Social History:  reports that she has never smoked. She does not have any smokeless tobacco history on file. She reports that she drinks about 1.2 ounces of alcohol per week. She reports that she does not use illicit drugs.  Allergies:  Allergies  Allergen Reactions  . Ciprofloxacin Nausea And Vomiting  . Codeine Nausea Only  . Sulfa Antibiotics Other (See Comments)    lethargy    No current facility-administered medications on file as of .   Medications Prior to Admission  Medication Sig Dispense Refill  . atorvastatin (LIPITOR) 40 MG tablet Take 40 mg by mouth daily.        . Calcium Carbonate-Vitamin D (CALCIUM + D) 600-200 MG-UNIT TABS Take 1 tablet by mouth daily.        Marland Kitchen lisinopril (PRINIVIL,ZESTRIL) 10 MG tablet Take 10 mg by mouth daily.        Marland Kitchen loperamide (IMODIUM A-D) 2 MG tablet Take 2 mg by mouth 3 (three) times daily as needed. For diarrhea       . Multiple Vitamins-Minerals (MULTIVITAMIN WITH MINERALS) tablet Take 1 tablet by mouth daily.        . promethazine (PHENERGAN) 25 MG tablet Take 12.5 mg by mouth 2 (two) times daily as needed. For nausea         No results found for this or any previous visit (from the past 48 hour(s)).  No results  found.   Pertinent items are noted in HPI.  There were no vitals taken for this visit.  General appearance: alert Head: Normocephalic, without obvious abnormality Neck: supple, symmetrical, trachea midline Resp: clear to auscultation bilaterally Cardio: regular rate and rhythm, S1, S2 normal, no murmur, click, rub or gallop GI: normal findings: bowel sounds normal Extremities:.  She has profound osteoarthritis of her distal interphalangeal joints, but has an unstable and angulated right index distal interphalangeal joint.  She is unable to apply significant force with her fingertip in pinch prehension due to instability and pain.  She has mild osteoarthritis of her left index finger DIP joint with angular deformity or rotational deformity.   Plain x-rays of her finger document end stage osteoarthritis.  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 Pulses: 2+ and symmetric Skin: normal Neurologic: Grossly normal    Assessment/Plan Impression: End stage OA DJD right index finger DIP joint  Plan: Arthrodesis right index finger DIP joint. The procedure , risks and post-op course were discussed with the patient and she was in agreement with the plan.  DASNOIT,Lilit Cinelli J 04/25/2011, 10:41 AM   H&P documentation: 04/25/2011  -History and Physical Reviewed  -Patient has been re-examined  -No change in the plan of care  Melinda Walker  Melinda HagemanJr, MD

## 2011-04-25 NOTE — Anesthesia Procedure Notes (Signed)
Procedure Name: LMA Insertion Date/Time: 04/25/2011 1:55 PM Performed by: Michio Thier D Pre-anesthesia Checklist: Patient identified, Emergency Drugs available, Suction available and Patient being monitored Patient Re-evaluated:Patient Re-evaluated prior to inductionOxygen Delivery Method: Circle System Utilized Preoxygenation: Pre-oxygenation with 100% oxygen Intubation Type: IV induction Ventilation: Mask ventilation without difficulty LMA: LMA inserted LMA Size: 4.0 Number of attempts: 1 Placement Confirmation: positive ETCO2 Tube secured with: Tape Dental Injury: Teeth and Oropharynx as per pre-operative assessment

## 2011-04-25 NOTE — Op Note (Signed)
OP NOTE DICTATED: 04/25/11 [id number]

## 2011-04-25 NOTE — Anesthesia Preprocedure Evaluation (Signed)
Anesthesia Evaluation  Patient identified by MRN, date of birth, ID band Patient awake    Reviewed: Allergy & Precautions, H&P , NPO status , Patient's Chart, lab work & pertinent test results  Airway Mallampati: III TM Distance: >3 FB Neck ROM: Limited    Dental No notable dental hx. (+) Teeth Intact, Caps and Dental Advisory Given   Pulmonary neg pulmonary ROS,  clear to auscultation  Pulmonary exam normal       Cardiovascular hypertension, On Medications neg cardio ROS Regular Normal    Neuro/Psych Negative Neurological ROS  Negative Psych ROS   GI/Hepatic negative GI ROS, Neg liver ROS,   Endo/Other  Negative Endocrine ROS  Renal/GU negative Renal ROS  Genitourinary negative   Musculoskeletal   Abdominal   Peds  Hematology negative hematology ROS (+)   Anesthesia Other Findings   Reproductive/Obstetrics negative OB ROS                           Anesthesia Physical Anesthesia Plan  ASA: II  Anesthesia Plan: General   Post-op Pain Management:    Induction: Intravenous  Airway Management Planned: LMA  Additional Equipment:   Intra-op Plan:   Post-operative Plan: Extubation in OR  Informed Consent: I have reviewed the patients History and Physical, chart, labs and discussed the procedure including the risks, benefits and alternatives for the proposed anesthesia with the patient or authorized representative who has indicated his/her understanding and acceptance.   Dental advisory given  Plan Discussed with: CRNA  Anesthesia Plan Comments:         Anesthesia Quick Evaluation

## 2011-04-25 NOTE — Transfer of Care (Signed)
Immediate Anesthesia Transfer of Care Note  Patient: Melinda Walker  Procedure(s) Performed:  ARTHRODESIS FINGER - Fusion right index distal interphalangeal  Patient Location: PACU  Anesthesia Type: General  Level of Consciousness: awake, alert , oriented and patient cooperative  Airway & Oxygen Therapy: Patient Spontanous Breathing and Patient connected to face mask oxygen  Post-op Assessment: Report given to PACU RN and Post -op Vital signs reviewed and stable  Post vital signs: Reviewed and stable  Complications: No apparent anesthesia complications

## 2011-04-26 NOTE — Op Note (Signed)
NAMESHAVANNA, FURNARI                 ACCOUNT NO.:  [id number]  MEDICAL RECORD NO.:  [id number]  LOCATION:  [facility]03                         FACILITY:  [facility]  PHYSICIAN:  Katy Fitch. Aiyla Baucom, M.D. DATE OF BIRTH:  September 06, 1934  DATE OF PROCEDURE:  04/25/2011 DATE OF DISCHARGE:  10/28/2010                              OPERATIVE REPORT   PREOPERATIVE DIAGNOSIS:  Unstable painful degenerative arthritis of right index finger distal phalangeal joint.  POSTOPERATIVE DIAGNOSIS:  Unstable painful degenerative arthritis of right index finger distal interphalangeal joint.  OPERATION:  Arthrodesis of right index finger distal interphalangeal joint with autogenous bone graft and micro Acutrak screw fixation.  OPERATING SURGEON:  Katy Fitch. Henry Utsey, MD  ASSISTANT:  Marveen Reeks Dasnoit, PA-C  ANESTHESIA:  General by LMA.  SUPERVISED ANESTHESIOLOGIST:  Zenon Mayo, MD  INDICATIONS:  Melinda Walker is a 77 year old woman referred through courtesy of Dr. Camillo Flaming, primary care physician for evaluation and management of an unstable painful right index finger distal interphalangeal joint.  Ms. Umbaugh has background significant osteoarthritis with large Heberden's nodes.  She had a 40 degree radial deviation deformity of her right index finger distal interphalangeal joint with large marginal osteophytes and significant discomfort.  She had difficulty with pinch and grip prehension due to instability and pain at the PIP joint.  She sought an upper extremity orthopedic consult and was advised to proceed with arthrodesis of the distal interphalangeal joint.  After informed consent, she was brought to the operating room at this time.  PROCEDURE:  Melinda Walker was brought to room 2 of the [facility] and placed in supine position on the operating table. Preoperatively, she was interviewed by Dr. Sampson Goon who recommended general anesthesia by LMA technique.  This was accepted by Ms.  Harcum.  In room 2 under Dr. Jarrett Ables direct supervision, general anesthesia by LMA technique was induced, followed by Betadine scrub and paint of the right upper extremity.  Ancef 1 g was administered as an IV prophylactic antibiotic.  Procedure commenced with a routine surgical time-out.  The right arm was exsanguinated with an Esmarch bandage and arterial tourniquet on proximal brachium inflated to 220 mmHg.  A curvilinear incision was fashioned exposing extensor mechanism of the right index finger distal interphalangeal joint.  The extensor was split 3-mm proximal to its insertion and the collateral ligaments were released. The DIP joint was opened shotgun style.  A complete synovectomy was accomplished, followed by removal of the marginal osteophytes at the base of the distal phalanx, taking care to elevate the extensor with a 64 Beaver blade and narrow razor sharp osteotome.  A cup and cone-type arthrodesis was accomplished utilizing a power bur to fashion the middle phalangeal head and the base of the distal phalanx.  A 0.035 inch Kirschner wire was then placed with retrograde technique through the intramedullary  canal of the distal phalanx, out the fingertip, followed by positioning of the finger in 5 degrees of flexion, slight supination and a few degrees of ulnar deviation to facilitate pulp to pulp pinch with the thumb.  The K-wire was then driven into the middle phalanx.  After proper alignment was achieved, the K-wire was  driven to the base of the middle phalanx to secure the K-wire.  A micro Acutrak reamer was then used to a depth of 20 mm, followed by placement of a 20 mm micro Acutrak 2 screw compressing the distal interphalangeal joint into the selected position.  AP, lateral, C-arm images confirmed excellent position of the joint and hardware.  The Kirschner wires removed, followed by repair of the extensor mechanism with grasping sutures of 4-0 Mersilene.   The IP joint was carefully palpated and marginal osteophytes at the base of the distal phalanx beneath the ulnar collateral ligament were removed.  The resected bone graft was morselized and placed between the middle phalanx and distal phalanx as an autogenous bone graft.  The skin was then repaired with intradermal 3-0 Prolene suture.  Steri-Strips were applied, followed by dressing the finger with Xeroflo sterile gauze and Alumafoam splint.  There were no apparent complications.  Mr. Buch tolerated the surgery and anesthesia well.  She was transferred to the recovery room in stable signs.  For aftercare, she is provided prescriptions for Percocet 5 mg 1 p.o. q.4-6 hours p.r.n. pain, 30 tablets without refill, also Keflex 500 mg 1 p.o. q.8 h. x4 days as a prophylactic antibiotic.     Katy Fitch Bethsaida Siegenthaler, M.D.     RVS/MEDQ  D:  04/25/2011  T:  04/26/2011  Job:  [id number]

## 2011-04-27 ENCOUNTER — Encounter (HOSPITAL_BASED_OUTPATIENT_CLINIC_OR_DEPARTMENT_OTHER): Payer: Self-pay | Admitting: Orthopedic Surgery

## 2011-09-05 ENCOUNTER — Telehealth (INDEPENDENT_AMBULATORY_CARE_PROVIDER_SITE_OTHER): Payer: Self-pay | Admitting: General Surgery

## 2011-09-05 ENCOUNTER — Encounter (INDEPENDENT_AMBULATORY_CARE_PROVIDER_SITE_OTHER): Payer: Medicare Other | Admitting: General Surgery

## 2011-09-05 NOTE — Telephone Encounter (Signed)
Pt calling for advise on diarrhea.  She is "old pt" of Dr. Lindie Spruce, but on-going pt of Dr. Pati Gallo.  Pt has on-going problems with constipation and then diarrhea, but diarrhea today.  Advised her to contact her GI physician, as this is a medical issue, not a surgical problem.  She understands and will comply.

## 2011-09-14 ENCOUNTER — Encounter (INDEPENDENT_AMBULATORY_CARE_PROVIDER_SITE_OTHER): Payer: Medicare Other | Admitting: Surgery

## 2012-01-29 ENCOUNTER — Other Ambulatory Visit: Payer: Self-pay | Admitting: Family Medicine

## 2012-01-29 DIAGNOSIS — Z1231 Encounter for screening mammogram for malignant neoplasm of breast: Secondary | ICD-10-CM

## 2012-01-29 DIAGNOSIS — M8580 Other specified disorders of bone density and structure, unspecified site: Secondary | ICD-10-CM

## 2012-03-06 ENCOUNTER — Ambulatory Visit: Payer: Medicare Other

## 2012-03-07 ENCOUNTER — Other Ambulatory Visit: Payer: Medicare Other

## 2012-03-07 ENCOUNTER — Ambulatory Visit: Payer: Medicare Other

## 2012-04-04 ENCOUNTER — Other Ambulatory Visit: Payer: Medicare Other

## 2012-04-04 ENCOUNTER — Ambulatory Visit: Payer: Medicare Other

## 2012-04-16 ENCOUNTER — Ambulatory Visit
Admission: RE | Admit: 2012-04-16 | Discharge: 2012-04-16 | Disposition: A | Discharge status: Home / Self Care | Payer: Medicare Other | Source: Ambulatory Visit | Attending: Family Medicine | Admitting: Family Medicine

## 2012-04-16 DIAGNOSIS — M8580 Other specified disorders of bone density and structure, unspecified site: Secondary | ICD-10-CM

## 2012-04-16 DIAGNOSIS — Z1231 Encounter for screening mammogram for malignant neoplasm of breast: Secondary | ICD-10-CM

## 2012-04-30 ENCOUNTER — Other Ambulatory Visit: Payer: Self-pay | Admitting: Gastroenterology

## 2012-04-30 DIAGNOSIS — R634 Abnormal weight loss: Secondary | ICD-10-CM

## 2012-04-30 DIAGNOSIS — R197 Diarrhea, unspecified: Secondary | ICD-10-CM

## 2012-05-03 ENCOUNTER — Other Ambulatory Visit: Payer: Medicare Other

## 2012-05-09 ENCOUNTER — Other Ambulatory Visit: Payer: Medicare Other

## 2013-08-25 ENCOUNTER — Other Ambulatory Visit: Payer: Self-pay | Admitting: Gastroenterology

## 2013-09-16 ENCOUNTER — Other Ambulatory Visit: Payer: Self-pay

## 2013-09-16 DIAGNOSIS — Z1231 Encounter for screening mammogram for malignant neoplasm of breast: Secondary | ICD-10-CM

## 2013-09-29 ENCOUNTER — Encounter (HOSPITAL_COMMUNITY): Payer: Self-pay | Admitting: Pharmacy Technician

## 2013-10-08 ENCOUNTER — Ambulatory Visit: Payer: Medicare Other

## 2013-10-08 ENCOUNTER — Encounter (HOSPITAL_COMMUNITY): Payer: Self-pay | Admitting: *Deleted

## 2013-10-20 ENCOUNTER — Ambulatory Visit (HOSPITAL_COMMUNITY): Payer: Medicare Other | Admitting: Anesthesiology

## 2013-10-20 ENCOUNTER — Encounter (HOSPITAL_COMMUNITY): Payer: Medicare Other | Admitting: Anesthesiology

## 2013-10-20 ENCOUNTER — Encounter (HOSPITAL_COMMUNITY)
Admission: RE | Disposition: A | Discharge status: Home / Self Care | Payer: Self-pay | Source: Ambulatory Visit | Attending: Gastroenterology

## 2013-10-20 ENCOUNTER — Ambulatory Visit (HOSPITAL_COMMUNITY)
Admission: RE | Admit: 2013-10-20 | Discharge: 2013-10-20 | Disposition: A | Discharge status: Home / Self Care | Payer: Medicare Other | Source: Ambulatory Visit | Attending: Gastroenterology | Admitting: Gastroenterology

## 2013-10-20 ENCOUNTER — Encounter (HOSPITAL_COMMUNITY): Payer: Self-pay | Admitting: *Deleted

## 2013-10-20 DIAGNOSIS — M949 Disorder of cartilage, unspecified: Secondary | ICD-10-CM

## 2013-10-20 DIAGNOSIS — M899 Disorder of bone, unspecified: Secondary | ICD-10-CM | POA: Diagnosis not present

## 2013-10-20 DIAGNOSIS — I10 Essential (primary) hypertension: Secondary | ICD-10-CM | POA: Diagnosis not present

## 2013-10-20 DIAGNOSIS — R197 Diarrhea, unspecified: Secondary | ICD-10-CM | POA: Diagnosis present

## 2013-10-20 DIAGNOSIS — Z882 Allergy status to sulfonamides status: Secondary | ICD-10-CM | POA: Diagnosis not present

## 2013-10-20 DIAGNOSIS — M1990 Unspecified osteoarthritis, unspecified site: Secondary | ICD-10-CM | POA: Insufficient documentation

## 2013-10-20 DIAGNOSIS — E7800 Pure hypercholesterolemia, unspecified: Secondary | ICD-10-CM | POA: Insufficient documentation

## 2013-10-20 DIAGNOSIS — Z888 Allergy status to other drugs, medicaments and biological substances status: Secondary | ICD-10-CM | POA: Insufficient documentation

## 2013-10-20 DIAGNOSIS — Z881 Allergy status to other antibiotic agents status: Secondary | ICD-10-CM | POA: Diagnosis not present

## 2013-10-20 DIAGNOSIS — Z885 Allergy status to narcotic agent status: Secondary | ICD-10-CM | POA: Insufficient documentation

## 2013-10-20 HISTORY — PX: COLONOSCOPY WITH PROPOFOL: SHX5780

## 2013-10-20 SURGERY — COLONOSCOPY WITH PROPOFOL
Anesthesia: Monitor Anesthesia Care

## 2013-10-20 MED ORDER — PROMETHAZINE HCL 25 MG/ML IJ SOLN
12.5000 mg | Freq: Once | INTRAMUSCULAR | Status: AC
  Administered 2013-10-20: 12.5 mg via INTRAVENOUS

## 2013-10-20 MED ORDER — PROMETHAZINE HCL 25 MG/ML IJ SOLN
INTRAMUSCULAR | Status: AC
  Filled 2013-10-20: qty 1

## 2013-10-20 MED ORDER — PROPOFOL 10 MG/ML IV BOLUS
INTRAVENOUS | Status: AC
  Filled 2013-10-20: qty 20

## 2013-10-20 MED ORDER — LACTATED RINGERS IV SOLN
INTRAVENOUS | Status: DC
  Administered 2013-10-20: 1000 mL via INTRAVENOUS

## 2013-10-20 MED ORDER — PROPOFOL 10 MG/ML IV BOLUS
INTRAVENOUS | Status: DC | PRN
  Administered 2013-10-20 (×2): 50 mg via INTRAVENOUS
  Administered 2013-10-20: 100 mg via INTRAVENOUS

## 2013-10-20 SURGICAL SUPPLY — 22 items

## 2013-10-20 NOTE — Op Note (Signed)
Problem: Chronic diarrhea with intermittent small-volume hematochezia. Normal colonoscopy performed 07/20/1997. Normal flexible proctosigmoidoscopy performed 07/12/2009. Normal esophagogastroduodenoscopy performed 05/09/2012  Endoscopist: Danise EdgeMartin Johnson  Premedication: Propofol administered by anesthesia  Procedure: Diagnostic colonoscopy with random colon biopsies The patient was placed in the left lateral decubitus position. Anal inspection and digital rectal exam were normal. The Pentax pediatric colonoscope was introduced into the rectum and advanced to the cecum. A normal-appearing appendiceal orifice and ileocecal valve were identified. Colonic preparation for the exam today was good  Rectum. Normal. Retroflexed view of the distal rectum showed large nonbleeding internal  Sigmoid colon and descending colon. Normal  Splenic flexure. Normal  Transverse colon. Normal  Hepatic flexure. Normal  Ascending colon. Normal  Cecum and ileocecal valve. Normal  Biopsies: Random colon biopsies were performed from the right colon and left colon to look for microscopic  Assessment: Normal colonoscopy. Random colon biopsies pending to look for microscopic colitis

## 2013-10-20 NOTE — Anesthesia Postprocedure Evaluation (Signed)
  Anesthesia Post-op Note  Patient: Melinda Walker  Procedure(s) Performed: Procedure(s): COLONOSCOPY WITH PROPOFOL (N/A)  Patient Location: PACU  Anesthesia Type:MAC  Level of Consciousness: awake, alert  and oriented  Airway and Oxygen Therapy: Patient Spontanous Breathing  Post-op Pain: none  Post-op Assessment: Post-op Vital signs reviewed  Post-op Vital Signs: Reviewed  Last Vitals:  Filed Vitals:   10/20/13 1331  BP: 113/50  Pulse: 68  Temp:   Resp: 17    Complications: No apparent anesthesia complications

## 2013-10-20 NOTE — Discharge Instructions (Signed)
Colonoscopy, Care After °These instructions give you information on caring for yourself after your procedure. Your doctor may also give you more specific instructions. Call your doctor if you have any problems or questions after your procedure. °HOME CARE °· Do not drive for 24 hours. °· Do not sign important papers or use machinery for 24 hours. °· You may shower. °· You may go back to your usual activities, but go slower for the first 24 hours. °· Take rest breaks often during the first 24 hours. °· Walk around or use warm packs on your belly (abdomen) if you have belly cramping or gas. °· Drink enough fluids to keep your pee (urine) clear or pale yellow. °· Resume your normal diet. Avoid heavy or fried foods. °· Avoid drinking alcohol for 24 hours or as told by your doctor. °· Only take medicines as told by your doctor. °If a tissue sample (biopsy) was taken during the procedure:  °· Do not take aspirin or blood thinners for 7 days, or as told by your doctor. °· Do not drink alcohol for 7 days, or as told by your doctor. °· Eat soft foods for the first 24 hours. °GET HELP IF: °You still have a small amount of blood in your poop (stool) 2-3 days after the procedure. °GET HELP RIGHT AWAY IF: °· You have more than a small amount of blood in your poop. °· You see clumps of tissue (blood clots) in your poop. °· Your belly is puffy (swollen). °· You feel sick to your stomach (nauseous) or throw up (vomit). °· You have a fever. °· You have belly pain that gets worse and medicine does not help. °MAKE SURE YOU: °· Understand these instructions. °· Will watch your condition. °· Will get help right away if you are not doing well or get worse. °Document Released: 04/08/2010 Document Revised: 03/11/2013 Document Reviewed: 11/11/2012 °ExitCare® Patient Information ©2015 [employer]. This information is not intended to replace advice given to you by your health care provider. Make sure you discuss any questions you have with  your health care provider. ° °Monitored Anesthesia Care °Monitored anesthesia care is an anesthesia service for a medical procedure. Anesthesia is the loss of the ability to feel pain. It is produced by medicines called anesthetics. It may affect a small area of your body (local anesthesia), a large area of your body (regional anesthesia), or your entire body (general anesthesia). The need for monitored anesthesia care depends your procedure, your condition, and the potential need for regional or general anesthesia. It is often provided during procedures where:  °· General anesthesia may be needed if there are complications. This is because you need special care when you are under general anesthesia.   °· You will be under local or regional anesthesia. This is so that you are able to have higher levels of anesthesia if needed.   °· You will receive calming medicines (sedatives). This is especially the case if sedatives are given to put you in a semi-conscious state of relaxation (deep sedation). This is because the amount of sedative needed to produce this state can be hard to predict. Too much of a sedative can produce general anesthesia. °Monitored anesthesia care is performed by one or more health care providers who have special training in all types of anesthesia. You will need to meet with these health care providers before your procedure. During this meeting, they will ask you about your medical history. They will also give you instructions to follow. (  For example, you will need to stop eating and drinking before your procedure. You may also need to stop or change medicines you are taking.) During your procedure, your health care providers will stay with you. They will:  °· Watch your condition. This includes watching your blood pressure, breathing, and level of pain.   °· Diagnose and treat problems that occur.   °· Give medicines if they are needed. These may include calming medicines (sedatives) and  anesthetics.   °· Make sure you are comfortable.   °Having monitored anesthesia care does not necessarily mean that you will be under anesthesia. It does mean that your health care providers will be able to manage anesthesia if you need it or if it occurs. It also means that you will be able to have a different type of anesthesia than you are having if you need it. When your procedure is complete, your health care providers will continue to watch your condition. They will make sure any medicines wear off before you are allowed to go home.  °Document Released: 11/30/2004 Document Revised: 07/21/2013 Document Reviewed: 04/17/2012 °ExitCare® Patient Information ©2015 [employer]. This information is not intended to replace advice given to you by your health care provider. Make sure you discuss any questions you have with your health care provider. ° ° °

## 2013-10-20 NOTE — Anesthesia Preprocedure Evaluation (Addendum)
Anesthesia Evaluation  Patient identified by MRN, date of birth, ID band Patient awake    Reviewed: Allergy & Precautions, H&P , NPO status , Patient's Chart, lab work & pertinent test results  Airway Mallampati: III TM Distance: >3 FB Neck ROM: Full    Dental  (+) Teeth Intact, Dental Advisory Given   Pulmonary neg pulmonary ROS,  breath sounds clear to auscultation        Cardiovascular Exercise Tolerance: Good hypertension, Rhythm:Regular Rate:Normal     Neuro/Psych negative neurological ROS     GI/Hepatic Neg liver ROS, +Diarrhea and hematochezia.   Endo/Other  negative endocrine ROS  Renal/GU negative Renal ROS     Musculoskeletal negative musculoskeletal ROS (+)   Abdominal   Peds  Hematology negative hematology ROS (+)   Anesthesia Other Findings   Reproductive/Obstetrics negative OB ROS                          Anesthesia Physical Anesthesia Plan  ASA: II  Anesthesia Plan: MAC   Post-op Pain Management:    Induction: Intravenous  Airway Management Planned: Simple Face Mask  Additional Equipment: None  Intra-op Plan:   Post-operative Plan:   Informed Consent: I have reviewed the patients History and Physical, chart, labs and discussed the procedure including the risks, benefits and alternatives for the proposed anesthesia with the patient or authorized representative who has indicated his/her understanding and acceptance.   Dental advisory given  Plan Discussed with: CRNA and Surgeon  Anesthesia Plan Comments:        Anesthesia Quick Evaluation

## 2013-10-20 NOTE — Transfer of Care (Signed)
Immediate Anesthesia Transfer of Care Note  Patient: Melinda MaxwellMary S Walker  Procedure(s) Performed: Procedure(s) (LRB): COLONOSCOPY WITH PROPOFOL (N/A)  Patient Location: PACU  Anesthesia Type: MAC  Level of Consciousness: sedated, patient cooperative and responds to stimulation  Airway & Oxygen Therapy: Patient Spontanous Breathing and Patient connected to face mask oxgen  Post-op Assessment: Report given to PACU RN and Post -op Vital signs reviewed and stable  Post vital signs: Reviewed and stable  Complications: No apparent anesthesia complications

## 2013-10-20 NOTE — H&P (Signed)
  Problem: Diarrhea associated with small-volume hematochezia. Normal colonoscopy performed 07/20/1997. Normal screening flexible proctosigmoidoscopy performed 07/12/2009. Normal esophagogastroduodenoscopy performed 05/09/2012  History: The patient is a 79 year old female born 11/23/1934. She has symptoms compatible with chronic diarrhea predominant irritable bowel syndrome and takes Imodium as needed. The patient's small-volume hematochezia has resolved.  The patient is scheduled to undergo diagnostic colonoscopy.  Past medical history: Nasal septum repair. Tonsillectomy. Hysterectomy for uterine fibroids. Cervical disc fusion. Hypertension. Hypercholesterolemia. Osteopenia. Allergies. Osteoarthritis.  Medication allergies: Codeine. Sulfa. Paxil. Cipro. Zoloft. Amoxicillin.  Exam: The patient is alert and lying comfortably on the endoscopy stretcher. Lungs are clear to auscultation. Cardiac exam reveals a regular rhythm. Abdomen is soft and nontender to palpation.  Plan: Proceed with diagnostic colonoscopy and perform random colon biopsies to look for microscopic colitis.

## 2013-10-21 ENCOUNTER — Encounter (HOSPITAL_COMMUNITY): Payer: Self-pay | Admitting: Gastroenterology

## 2013-11-03 ENCOUNTER — Ambulatory Visit
Admission: RE | Admit: 2013-11-03 | Discharge: 2013-11-03 | Disposition: A | Discharge status: Home / Self Care | Payer: Medicare Other | Source: Ambulatory Visit

## 2013-11-03 DIAGNOSIS — Z1231 Encounter for screening mammogram for malignant neoplasm of breast: Secondary | ICD-10-CM

## 2013-11-05 ENCOUNTER — Other Ambulatory Visit: Payer: Self-pay | Admitting: Family Medicine

## 2013-11-05 DIAGNOSIS — R928 Other abnormal and inconclusive findings on diagnostic imaging of breast: Secondary | ICD-10-CM

## 2013-11-11 ENCOUNTER — Ambulatory Visit
Admission: RE | Admit: 2013-11-11 | Discharge: 2013-11-11 | Disposition: A | Discharge status: Home / Self Care | Payer: Medicare Other | Source: Ambulatory Visit | Attending: Family Medicine | Admitting: Family Medicine

## 2013-11-11 DIAGNOSIS — R928 Other abnormal and inconclusive findings on diagnostic imaging of breast: Secondary | ICD-10-CM

## 2014-11-12 ENCOUNTER — Other Ambulatory Visit: Payer: Self-pay

## 2014-11-12 DIAGNOSIS — Z1231 Encounter for screening mammogram for malignant neoplasm of breast: Secondary | ICD-10-CM

## 2014-11-17 ENCOUNTER — Ambulatory Visit
Admission: RE | Admit: 2014-11-17 | Discharge: 2014-11-17 | Disposition: A | Discharge status: Home / Self Care | Payer: Medicare Other | Source: Ambulatory Visit

## 2014-11-17 DIAGNOSIS — Z1231 Encounter for screening mammogram for malignant neoplasm of breast: Secondary | ICD-10-CM

## 2014-12-08 ENCOUNTER — Other Ambulatory Visit: Payer: Self-pay | Admitting: Family Medicine

## 2014-12-08 DIAGNOSIS — M8580 Other specified disorders of bone density and structure, unspecified site: Secondary | ICD-10-CM

## 2014-12-16 ENCOUNTER — Other Ambulatory Visit: Payer: Self-pay | Admitting: *Deleted

## 2014-12-16 DIAGNOSIS — I83813 Varicose veins of bilateral lower extremities with pain: Secondary | ICD-10-CM

## 2015-01-08 ENCOUNTER — Inpatient Hospital Stay: Admission: RE | Admit: 2015-01-08 | Payer: Medicare Other | Source: Ambulatory Visit

## 2015-02-09 ENCOUNTER — Ambulatory Visit
Admission: RE | Admit: 2015-02-09 | Discharge: 2015-02-09 | Disposition: A | Discharge status: Home / Self Care | Payer: Medicare Other | Source: Ambulatory Visit | Attending: Family Medicine | Admitting: Family Medicine

## 2015-02-09 DIAGNOSIS — M8580 Other specified disorders of bone density and structure, unspecified site: Secondary | ICD-10-CM

## 2015-02-17 ENCOUNTER — Encounter (HOSPITAL_COMMUNITY): Payer: Medicare Other

## 2015-02-17 ENCOUNTER — Encounter: Payer: Medicare Other | Admitting: Vascular Surgery

## 2015-03-16 ENCOUNTER — Encounter: Payer: Self-pay | Admitting: Vascular Surgery

## 2015-03-18 ENCOUNTER — Encounter: Payer: Self-pay | Admitting: Vascular Surgery

## 2015-03-24 ENCOUNTER — Encounter: Payer: Medicare Other | Admitting: Vascular Surgery

## 2015-03-24 ENCOUNTER — Encounter (HOSPITAL_COMMUNITY): Payer: Medicare Other

## 2015-03-29 ENCOUNTER — Encounter: Payer: Self-pay | Admitting: Vascular Surgery

## 2015-03-29 ENCOUNTER — Encounter (HOSPITAL_COMMUNITY): Payer: Medicare Other

## 2015-05-18 ENCOUNTER — Encounter: Payer: Self-pay | Admitting: Vascular Surgery

## 2015-05-25 ENCOUNTER — Encounter: Payer: Medicare Other | Admitting: Vascular Surgery

## 2015-05-25 ENCOUNTER — Encounter (HOSPITAL_COMMUNITY): Payer: Medicare Other

## 2015-07-30 ENCOUNTER — Encounter: Payer: Self-pay | Admitting: Vascular Surgery

## 2015-08-04 ENCOUNTER — Ambulatory Visit (HOSPITAL_COMMUNITY)
Admission: RE | Admit: 2015-08-04 | Discharge: 2015-08-04 | Disposition: A | Discharge status: Home / Self Care | Payer: Medicare Other | Source: Ambulatory Visit | Attending: Vascular Surgery | Admitting: Vascular Surgery

## 2015-08-04 ENCOUNTER — Encounter: Payer: Self-pay | Admitting: Vascular Surgery

## 2015-08-04 ENCOUNTER — Ambulatory Visit (INDEPENDENT_AMBULATORY_CARE_PROVIDER_SITE_OTHER): Payer: Medicare Other | Admitting: Vascular Surgery

## 2015-08-04 VITALS — BP 141/78 | HR 57 | Temp 97.8°F | Resp 14 | Ht 63.0 in | Wt 113.0 lb

## 2015-08-04 DIAGNOSIS — I83813 Varicose veins of bilateral lower extremities with pain: Secondary | ICD-10-CM | POA: Diagnosis present

## 2015-08-04 DIAGNOSIS — I872 Venous insufficiency (chronic) (peripheral): Secondary | ICD-10-CM

## 2015-08-04 NOTE — Progress Notes (Signed)
Duplicate note

## 2015-08-04 NOTE — Progress Notes (Signed)
[facility]  Patient name: Melinda Walker MRN: [id number] DOB: 01/28/1935 Sex: female  REASON FOR CONSULT: Bilateral varicose veins. Referred by Dr. Juluis Rainier  HPI: Melinda Walker is a 81 y.o. female, who is referred for evaluation of varicose veins. She has a long history of varicose veins of both lower extremities. She experiences some burning pain in both legs. Her symptoms are aggravated by standing and relieved somewhat with elevation. She's never had any bleeding episodes from her varicose veins. She denies any history of DVT or phlebitis. She does occasionally get some aching heaviness in her legs with standing. She is fairly active. She does wear compression stockings which she feels are helpful.  Past Medical History  Diagnosis Date  . High cholesterol   . Hypertension   . Arthritis   . Anxiety     No family history on file. There is no family history of premature cardiovascular disease.  SOCIAL HISTORY: Social History   Social History  . Marital Status: Widowed    Spouse Name: N/A  . Number of Children: N/A  . Years of Education: N/A   Occupational History  . Not on file.   Social History Main Topics  . Smoking status: Never Smoker   . Smokeless tobacco: Never Used  . Alcohol Use: 1.2 oz/week    2 Glasses of wine per week  . Drug Use: No  . Sexual Activity: No   Other Topics Concern  . Not on file   Social History Narrative    Allergies  Allergen Reactions  . Ciprofloxacin Nausea And Vomiting  . Codeine Nausea Only  . Sulfa Antibiotics Other (See Comments)    lethargy    Current Outpatient Prescriptions  Medication Sig Dispense Refill  . atorvastatin (LIPITOR) 40 MG tablet Take 40 mg by mouth daily.      . bacitracin ophthalmic ointment Place 1 application into both eyes daily as needed for wound care. apply to eye    . Calcium Carbonate-Vitamin D (CALCIUM + D) 600-200 MG-UNIT TABS Take 1 tablet by mouth daily.       . carboxymethylcellulose (REFRESH PLUS) 0.5 % SOLN Place 1 drop into both eyes 3 (three) times daily as needed (dry eyes).    . cholecalciferol (VITAMIN D) 1000 UNITS tablet Take 1,000 Units by mouth daily.    . Eyelid Cleansers (OCUSOFT LID SCRUB) PADS Apply 1 each topically every other day.    . lisinopril (PRINIVIL,ZESTRIL) 10 MG tablet Take 10 mg by mouth every morning.     . Multiple Vitamins-Minerals (MULTIVITAMIN WITH MINERALS) tablet Take 1 tablet by mouth daily.      . hydrOXYzine (ATARAX/VISTARIL) 25 MG tablet Take 12.5 mg by mouth daily as needed for anxiety. Reported on 08/04/2015    . loperamide (IMODIUM A-D) 2 MG tablet Take 2 mg by mouth 3 (three) times daily as needed. Reported on 08/04/2015    . promethazine (PHENERGAN) 25 MG tablet Take 12.5 mg by mouth 2 (two) times daily as needed. Reported on 08/04/2015     No current facility-administered medications for this visit.    REVIEW OF SYSTEMS:   denotes positive finding,  denotes negative finding Cardiac  Comments:  Chest pain or chest pressure:    Shortness of breath upon exertion:    Short of breath when lying flat:    Irregular heart rhythm:        Vascular    Pain in calf, thigh, or  hip brought on by ambulation:    Pain in feet at night that wakes you up from your sleep:     Blood clot in your veins:    Leg swelling:         Pulmonary    Oxygen at home:    Productive cough:     Wheezing:         Neurologic    Sudden weakness in arms or legs:     Sudden numbness in arms or legs:     Sudden onset of difficulty speaking or slurred speech:    Temporary loss of vision in one eye:     Problems with dizziness:         Gastrointestinal    Blood in stool:     Vomited blood:         Genitourinary    Burning when urinating:     Blood in urine:        Psychiatric    Major depression:         Hematologic    Bleeding problems:    Problems with blood clotting too easily:        Skin    Rashes or  ulcers:        Constitutional    Fever or chills:      PHYSICAL EXAM: Filed Vitals:   08/04/15 1409 08/04/15 1410  BP: 143/76 141/78  Pulse: 58 57  Temp: 97.8 F (36.6 C)   Resp: 14   Height: 5\' 3"  (1.6 m)   Weight: 113 lb (51.256 kg)   SpO2: 99%     GENERAL: The patient is a well-nourished female, in no acute distress. The vital signs are documented above. CARDIAC: There is a regular rate and rhythm.  VASCULAR: I do not detect carotid bruits. She has popliteal and pedal pulses bilaterally. She has no significant bilateral lower extremity swelling. PULMONARY: There is good air exchange bilaterally without wheezing or rales. ABDOMEN: Soft and non-tender with normal pitched bowel sounds.  MUSCULOSKELETAL: There are no major deformities or cyanosis. NEUROLOGIC: No focal weakness or paresthesias are detected. SKIN: She has some small varicose veins and reticular veins bilaterally. She does not have significant hyperpigmentation. PSYCHIATRIC: The patient has a normal affect.  DATA:   BILATERAL LOWER EXTREMITY VENOUS DUPLEX: I have independently interpreted her bilateral lower extremity venous duplex scan.  On the right side, there is no evidence of DVT or superficial thrombophlebitis. She does have some deep vein reflux involving the common femoral vein. There is no reflux in the right great saphenous vein or small saphenous vein.  On the left side, there is no evidence of DVT or superficial thrombophlebitis. She does have deep vein reflux involving the common femoral vein on the left. She also has a short segment of reflux in the left great saphenous vein just above the knee. There is no reflux in the small saphenous vein.  MEDICAL ISSUES:  CHRONIC VENOUS INSUFFICIENCY: Based on her duplex and she does have evidence of chronic venous insufficiency. She does not have significant reflux in the great saphenous veins and therefore is not a candidate for laser ablation. The reticular  veins are fairly  Large and she may not be a good candidate for sclerotherapy. We have discussed the importance of intermittent leg elevation in the proper positioning for this. I have also encouraged her to wear her compression stockings. We have discussed the importance of avoiding prolonged sitting and standing. I've encouraged her  to stay as active as possible and keep her skin well lubricated. We also discussed water aerobics which I think is also helpful for people with venous disease. I'll be happy to see her back at anytime if any vascular issues arise.   Waverly Ferrariickson, Jamerius Boeckman [facility] [place] Beeper: [phone]

## 2015-08-04 NOTE — Progress Notes (Signed)
Filed Vitals:   08/04/15 1409 08/04/15 1410  BP: 143/76 141/78  Pulse: 58 57  Temp: 97.8 F (36.6 C)   Resp: 14   Height: 5\' 3"  (1.6 m)   Weight: 113 lb (51.256 kg)   SpO2: 99%

## 2015-11-04 ENCOUNTER — Other Ambulatory Visit: Payer: Self-pay | Admitting: Family Medicine

## 2015-11-04 DIAGNOSIS — Z1231 Encounter for screening mammogram for malignant neoplasm of breast: Secondary | ICD-10-CM

## 2015-11-18 ENCOUNTER — Ambulatory Visit
Admission: RE | Admit: 2015-11-18 | Discharge: 2015-11-18 | Disposition: A | Discharge status: Home / Self Care | Payer: Medicare Other | Source: Ambulatory Visit | Attending: Family Medicine | Admitting: Family Medicine

## 2015-11-18 DIAGNOSIS — Z1231 Encounter for screening mammogram for malignant neoplasm of breast: Secondary | ICD-10-CM

## 2016-01-31 ENCOUNTER — Other Ambulatory Visit: Payer: Self-pay | Admitting: Family Medicine

## 2016-01-31 DIAGNOSIS — R9389 Abnormal findings on diagnostic imaging of other specified body structures: Secondary | ICD-10-CM

## 2016-02-08 ENCOUNTER — Ambulatory Visit
Admission: RE | Admit: 2016-02-08 | Discharge: 2016-02-08 | Disposition: A | Discharge status: Home / Self Care | Payer: Medicare Other | Source: Ambulatory Visit | Attending: Family Medicine | Admitting: Family Medicine

## 2016-02-08 DIAGNOSIS — R9389 Abnormal findings on diagnostic imaging of other specified body structures: Secondary | ICD-10-CM

## 2016-10-16 ENCOUNTER — Other Ambulatory Visit: Payer: Self-pay | Admitting: Family Medicine

## 2016-10-16 DIAGNOSIS — Z1231 Encounter for screening mammogram for malignant neoplasm of breast: Secondary | ICD-10-CM

## 2016-11-30 ENCOUNTER — Ambulatory Visit: Payer: Medicare Other

## 2016-12-14 ENCOUNTER — Ambulatory Visit: Payer: Medicare Other

## 2016-12-21 ENCOUNTER — Ambulatory Visit
Admission: RE | Admit: 2016-12-21 | Discharge: 2016-12-21 | Disposition: A | Discharge status: Home / Self Care | Payer: Medicare Other | Source: Ambulatory Visit | Attending: Family Medicine | Admitting: Family Medicine

## 2016-12-21 DIAGNOSIS — Z1231 Encounter for screening mammogram for malignant neoplasm of breast: Secondary | ICD-10-CM

## 2017-02-07 ENCOUNTER — Other Ambulatory Visit: Payer: Self-pay | Admitting: Family Medicine

## 2017-02-07 DIAGNOSIS — M8580 Other specified disorders of bone density and structure, unspecified site: Secondary | ICD-10-CM

## 2017-03-02 ENCOUNTER — Inpatient Hospital Stay
Admission: RE | Admit: 2017-03-02 | Discharge: 2017-03-02 | Disposition: A | Discharge status: Home / Self Care | Payer: Medicare Other | Source: Ambulatory Visit | Attending: Family Medicine | Admitting: Family Medicine

## 2017-03-30 ENCOUNTER — Other Ambulatory Visit: Payer: Medicare Other

## 2017-04-18 ENCOUNTER — Inpatient Hospital Stay
Admission: RE | Admit: 2017-04-18 | Discharge: 2017-04-18 | Disposition: A | Discharge status: Home / Self Care | Payer: Medicare Other | Source: Ambulatory Visit | Attending: Family Medicine | Admitting: Family Medicine

## 2017-05-04 ENCOUNTER — Ambulatory Visit
Admission: RE | Admit: 2017-05-04 | Discharge: 2017-05-04 | Disposition: A | Discharge status: Home / Self Care | Payer: Medicare Other | Source: Ambulatory Visit | Attending: Family Medicine | Admitting: Family Medicine

## 2017-05-04 DIAGNOSIS — M8580 Other specified disorders of bone density and structure, unspecified site: Secondary | ICD-10-CM

## 2018-01-04 ENCOUNTER — Other Ambulatory Visit: Payer: Self-pay | Admitting: Family Medicine

## 2018-01-04 DIAGNOSIS — Z1231 Encounter for screening mammogram for malignant neoplasm of breast: Secondary | ICD-10-CM

## 2018-02-08 ENCOUNTER — Ambulatory Visit: Payer: Medicare Other

## 2018-03-22 ENCOUNTER — Ambulatory Visit
Admission: RE | Admit: 2018-03-22 | Discharge: 2018-03-22 | Disposition: A | Discharge status: Home / Self Care | Payer: Medicare Other | Source: Ambulatory Visit | Attending: Family Medicine | Admitting: Family Medicine

## 2018-03-22 DIAGNOSIS — Z1231 Encounter for screening mammogram for malignant neoplasm of breast: Secondary | ICD-10-CM

## 2019-03-03 ENCOUNTER — Other Ambulatory Visit: Payer: Self-pay | Admitting: Family Medicine

## 2019-03-03 DIAGNOSIS — Z1231 Encounter for screening mammogram for malignant neoplasm of breast: Secondary | ICD-10-CM

## 2019-03-03 DIAGNOSIS — M8580 Other specified disorders of bone density and structure, unspecified site: Secondary | ICD-10-CM

## 2019-03-29 ENCOUNTER — Ambulatory Visit: Payer: Medicare Other | Attending: Internal Medicine

## 2019-03-29 DIAGNOSIS — Z23 Encounter for immunization: Secondary | ICD-10-CM | POA: Insufficient documentation

## 2019-03-29 NOTE — Progress Notes (Signed)
   Covid-19 Vaccination Clinic  Name:  Melinda Walker    MRN: [id number] DOB: Dec 14, 1934  03/29/2019  Melinda Walker was observed post Covid-19 immunization for 15 minutes without incidence. She was provided with Vaccine Information Sheet and instruction to access the V-Safe system.   Melinda Walker was instructed to call 911 with any severe reactions post vaccine: Marland Kitchen Difficulty breathing  . Swelling of your face and throat  . A fast heartbeat  . A bad rash all over your body  . Dizziness and weakness    Immunizations Administered    Name Date Dose VIS Date Route   Pfizer COVID-19 Vaccine 03/29/2019 12:17 PM 0.3 mL 02/28/2019 Intramuscular   Manufacturer: [employer], [employer]   Lot: [id number]   NDC: [address]-2

## 2019-04-16 ENCOUNTER — Ambulatory Visit: Payer: Medicare Other

## 2019-04-19 ENCOUNTER — Ambulatory Visit: Payer: Medicare Other | Attending: Internal Medicine

## 2019-04-19 DIAGNOSIS — Z23 Encounter for immunization: Secondary | ICD-10-CM

## 2019-04-19 NOTE — Progress Notes (Signed)
   Covid-19 Vaccination Clinic  Name:  Melinda Walker    MRN: [id number] DOB: 1934-05-19  04/19/2019  [name] was observed post Covid-19 immunization for 15 minutes without incidence. She was provided with Vaccine Information Sheet and instruction to access the V-Safe system.   [name] was instructed to call 911 with any severe reactions post vaccine: Marland Kitchen Difficulty breathing  . Swelling of your face and throat  . A fast heartbeat  . A bad rash all over your body  . Dizziness and weakness    Immunizations Administered    Name Date Dose VIS Date Route   Pfizer COVID-19 Vaccine 04/19/2019 10:56 AM 0.3 mL 02/28/2019 Intramuscular   Manufacturer: [employer], [employer]   Lot: [id number]   NDC: [address]-5

## 2019-05-30 ENCOUNTER — Ambulatory Visit: Payer: Medicare Other

## 2019-05-30 ENCOUNTER — Other Ambulatory Visit: Payer: Medicare Other

## 2019-08-08 ENCOUNTER — Other Ambulatory Visit: Payer: Medicare Other

## 2019-08-08 ENCOUNTER — Ambulatory Visit: Payer: Medicare Other

## 2019-08-12 ENCOUNTER — Ambulatory Visit
Admission: RE | Admit: 2019-08-12 | Discharge: 2019-08-12 | Disposition: A | Discharge status: Home / Self Care | Payer: Medicare Other | Source: Ambulatory Visit | Attending: Family Medicine | Admitting: Family Medicine

## 2019-08-12 ENCOUNTER — Other Ambulatory Visit: Payer: Self-pay

## 2019-08-12 DIAGNOSIS — Z1231 Encounter for screening mammogram for malignant neoplasm of breast: Secondary | ICD-10-CM

## 2019-10-23 ENCOUNTER — Other Ambulatory Visit: Payer: Self-pay

## 2019-10-23 ENCOUNTER — Ambulatory Visit
Admission: RE | Admit: 2019-10-23 | Discharge: 2019-10-23 | Disposition: A | Discharge status: Home / Self Care | Payer: Medicare Other | Source: Ambulatory Visit | Attending: Family Medicine | Admitting: Family Medicine

## 2019-10-23 DIAGNOSIS — M8580 Other specified disorders of bone density and structure, unspecified site: Secondary | ICD-10-CM

## 2020-03-23 DIAGNOSIS — H53001 Unspecified amblyopia, right eye: Secondary | ICD-10-CM | POA: Diagnosis not present

## 2020-03-23 DIAGNOSIS — H401122 Primary open-angle glaucoma, left eye, moderate stage: Secondary | ICD-10-CM | POA: Diagnosis not present

## 2020-03-23 DIAGNOSIS — H524 Presbyopia: Secondary | ICD-10-CM | POA: Diagnosis not present

## 2020-04-07 DIAGNOSIS — N3000 Acute cystitis without hematuria: Secondary | ICD-10-CM | POA: Diagnosis not present

## 2020-04-07 DIAGNOSIS — R300 Dysuria: Secondary | ICD-10-CM | POA: Diagnosis not present

## 2020-04-07 DIAGNOSIS — N390 Urinary tract infection, site not specified: Secondary | ICD-10-CM | POA: Diagnosis not present

## 2020-04-28 DIAGNOSIS — Z961 Presence of intraocular lens: Secondary | ICD-10-CM | POA: Diagnosis not present

## 2020-04-28 DIAGNOSIS — H01004 Unspecified blepharitis left upper eyelid: Secondary | ICD-10-CM | POA: Diagnosis not present

## 2020-04-28 DIAGNOSIS — H401122 Primary open-angle glaucoma, left eye, moderate stage: Secondary | ICD-10-CM | POA: Diagnosis not present

## 2020-04-28 DIAGNOSIS — H01001 Unspecified blepharitis right upper eyelid: Secondary | ICD-10-CM | POA: Diagnosis not present

## 2020-06-08 DIAGNOSIS — I10 Essential (primary) hypertension: Secondary | ICD-10-CM | POA: Diagnosis not present

## 2020-06-08 DIAGNOSIS — E7800 Pure hypercholesterolemia, unspecified: Secondary | ICD-10-CM | POA: Diagnosis not present

## 2020-06-08 DIAGNOSIS — H409 Unspecified glaucoma: Secondary | ICD-10-CM | POA: Diagnosis not present

## 2020-07-09 DIAGNOSIS — R6883 Chills (without fever): Secondary | ICD-10-CM | POA: Diagnosis not present

## 2020-07-09 DIAGNOSIS — R1030 Lower abdominal pain, unspecified: Secondary | ICD-10-CM | POA: Diagnosis not present

## 2020-07-09 DIAGNOSIS — K580 Irritable bowel syndrome with diarrhea: Secondary | ICD-10-CM | POA: Diagnosis not present

## 2020-07-12 ENCOUNTER — Other Ambulatory Visit: Payer: Self-pay | Admitting: Family Medicine

## 2020-07-12 DIAGNOSIS — Z1231 Encounter for screening mammogram for malignant neoplasm of breast: Secondary | ICD-10-CM

## 2020-07-30 DIAGNOSIS — I10 Essential (primary) hypertension: Secondary | ICD-10-CM | POA: Diagnosis not present

## 2020-07-30 DIAGNOSIS — H409 Unspecified glaucoma: Secondary | ICD-10-CM | POA: Diagnosis not present

## 2020-07-30 DIAGNOSIS — E7800 Pure hypercholesterolemia, unspecified: Secondary | ICD-10-CM | POA: Diagnosis not present

## 2020-08-31 ENCOUNTER — Ambulatory Visit
Admission: RE | Admit: 2020-08-31 | Discharge: 2020-08-31 | Disposition: A | Discharge status: Home / Self Care | Payer: Medicare Other | Source: Ambulatory Visit | Attending: Family Medicine | Admitting: Family Medicine

## 2020-08-31 ENCOUNTER — Other Ambulatory Visit: Payer: Self-pay

## 2020-08-31 DIAGNOSIS — Z1231 Encounter for screening mammogram for malignant neoplasm of breast: Secondary | ICD-10-CM

## 2020-09-01 DIAGNOSIS — H53001 Unspecified amblyopia, right eye: Secondary | ICD-10-CM | POA: Diagnosis not present

## 2020-09-01 DIAGNOSIS — H401132 Primary open-angle glaucoma, bilateral, moderate stage: Secondary | ICD-10-CM | POA: Diagnosis not present

## 2020-09-01 DIAGNOSIS — H0100A Unspecified blepharitis right eye, upper and lower eyelids: Secondary | ICD-10-CM | POA: Diagnosis not present

## 2020-09-01 DIAGNOSIS — H0100B Unspecified blepharitis left eye, upper and lower eyelids: Secondary | ICD-10-CM | POA: Diagnosis not present

## 2020-09-06 DIAGNOSIS — E7800 Pure hypercholesterolemia, unspecified: Secondary | ICD-10-CM | POA: Diagnosis not present

## 2020-09-06 DIAGNOSIS — Z681 Body mass index (BMI) 19 or less, adult: Secondary | ICD-10-CM | POA: Diagnosis not present

## 2020-09-06 DIAGNOSIS — K580 Irritable bowel syndrome with diarrhea: Secondary | ICD-10-CM | POA: Diagnosis not present

## 2020-09-06 DIAGNOSIS — I10 Essential (primary) hypertension: Secondary | ICD-10-CM | POA: Diagnosis not present

## 2020-09-06 DIAGNOSIS — H6123 Impacted cerumen, bilateral: Secondary | ICD-10-CM | POA: Diagnosis not present

## 2020-09-28 DIAGNOSIS — H409 Unspecified glaucoma: Secondary | ICD-10-CM | POA: Diagnosis not present

## 2020-09-28 DIAGNOSIS — E7800 Pure hypercholesterolemia, unspecified: Secondary | ICD-10-CM | POA: Diagnosis not present

## 2020-09-28 DIAGNOSIS — I10 Essential (primary) hypertension: Secondary | ICD-10-CM | POA: Diagnosis not present

## 2020-10-04 DIAGNOSIS — H6123 Impacted cerumen, bilateral: Secondary | ICD-10-CM | POA: Diagnosis not present

## 2020-11-16 DIAGNOSIS — S7001XA Contusion of right hip, initial encounter: Secondary | ICD-10-CM | POA: Diagnosis not present

## 2020-11-16 DIAGNOSIS — S60211A Contusion of right wrist, initial encounter: Secondary | ICD-10-CM | POA: Diagnosis not present

## 2020-12-10 DIAGNOSIS — I10 Essential (primary) hypertension: Secondary | ICD-10-CM | POA: Diagnosis not present

## 2020-12-10 DIAGNOSIS — H409 Unspecified glaucoma: Secondary | ICD-10-CM | POA: Diagnosis not present

## 2020-12-10 DIAGNOSIS — E7800 Pure hypercholesterolemia, unspecified: Secondary | ICD-10-CM | POA: Diagnosis not present

## 2020-12-13 DIAGNOSIS — S0502XA Injury of conjunctiva and corneal abrasion without foreign body, left eye, initial encounter: Secondary | ICD-10-CM | POA: Diagnosis not present

## 2020-12-16 DIAGNOSIS — H10412 Chronic giant papillary conjunctivitis, left eye: Secondary | ICD-10-CM | POA: Diagnosis not present

## 2021-01-23 IMAGING — MG DIGITAL SCREENING BILAT W/ TOMO W/ CAD
6 of 10 series · 6 of 30 positions shown · non-contrast
Comparison: Previous exam(s).

CLINICAL DATA: Screening.

EXAM:
DIGITAL SCREENING BILATERAL MAMMOGRAM WITH TOMO AND CAD

[R MLO synth-2D]
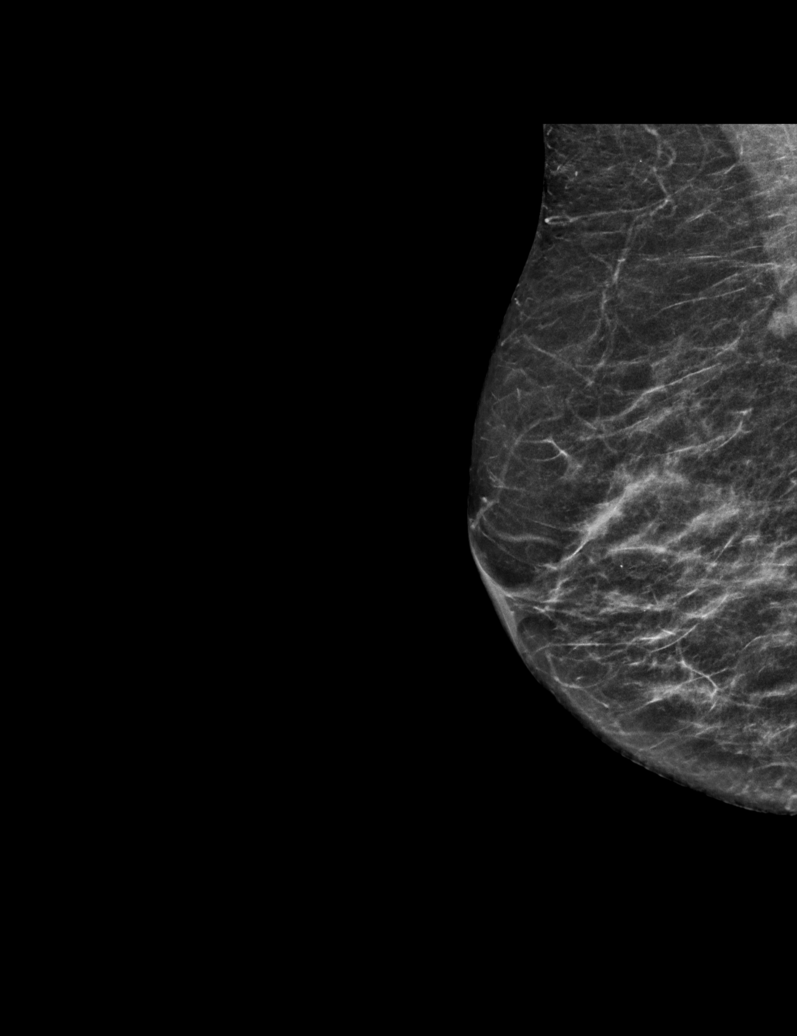

[L MLO synth-2D (1 of 2)]
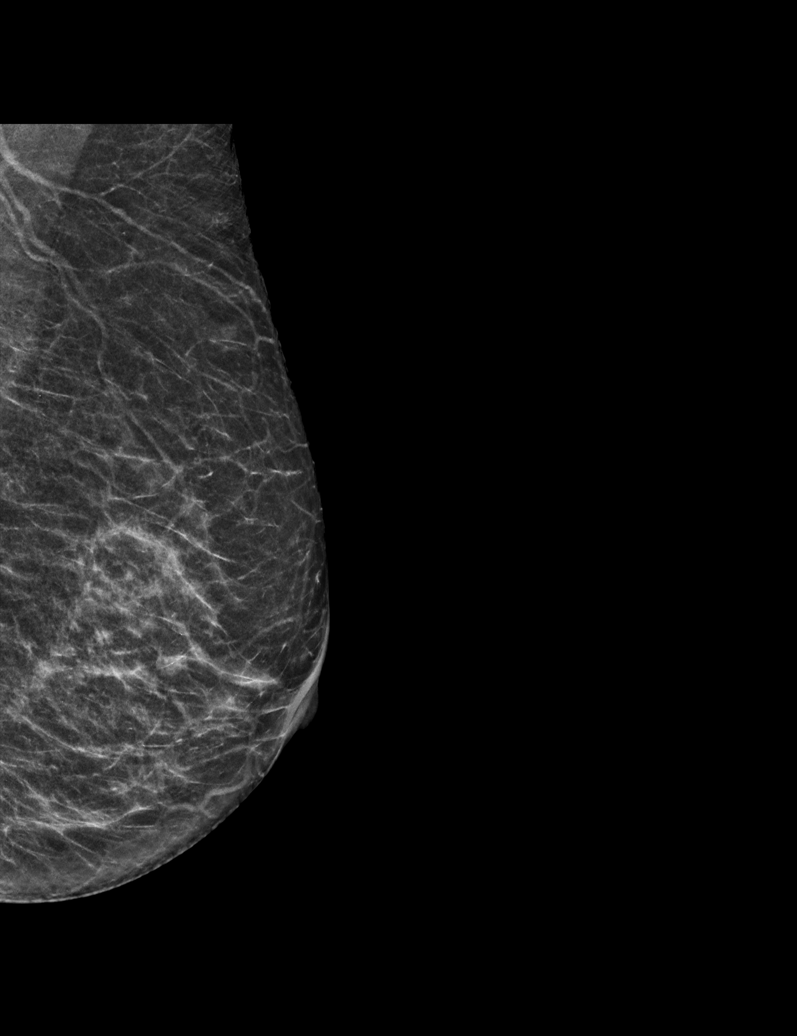

[L CC synth-2D]
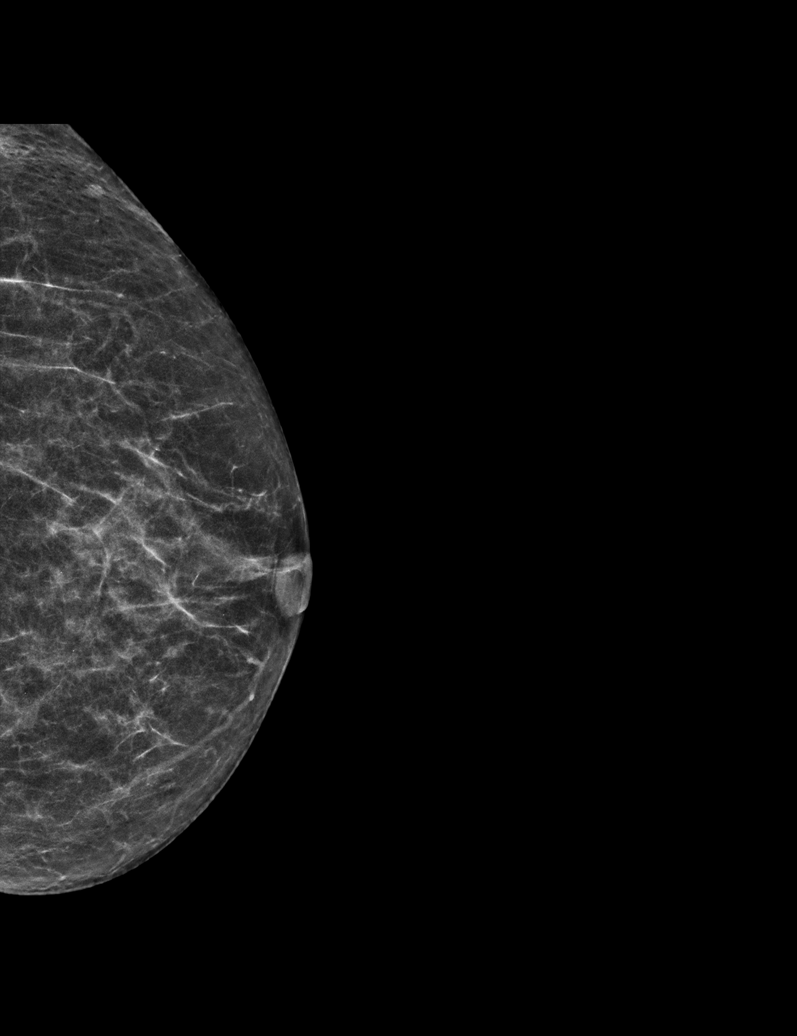

[L MLO synth-2D (2 of 2)]
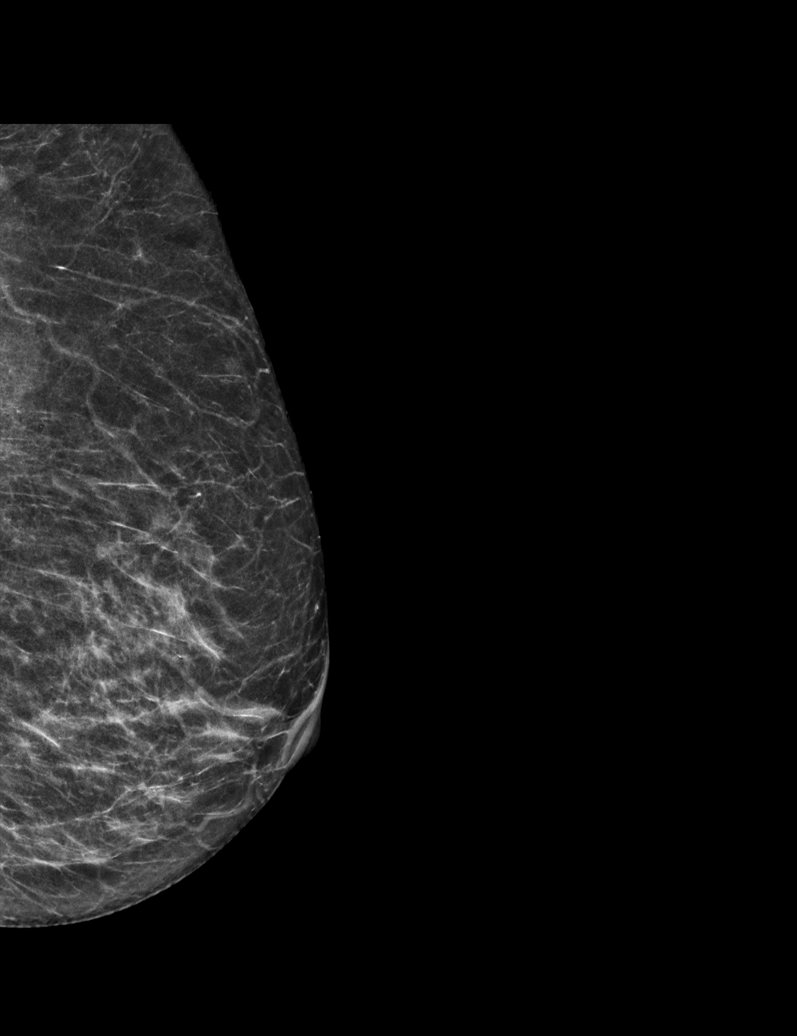

[R CC synth-2D]
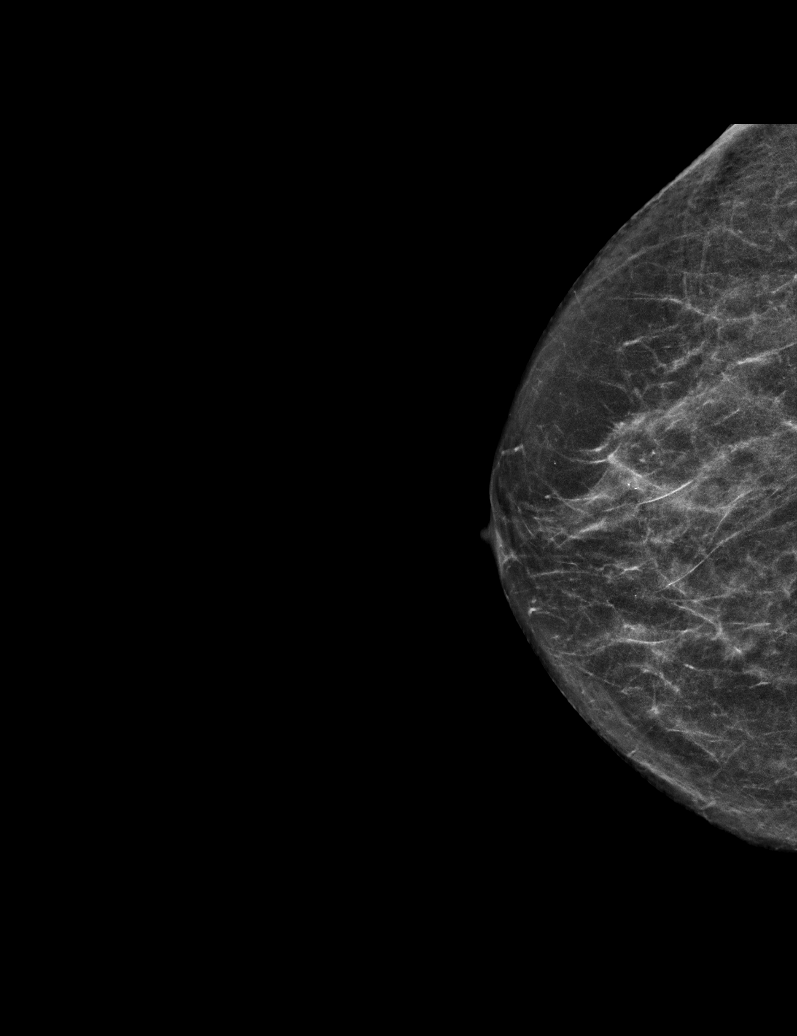

[L CC tomo · tomo slice 26/51.0]
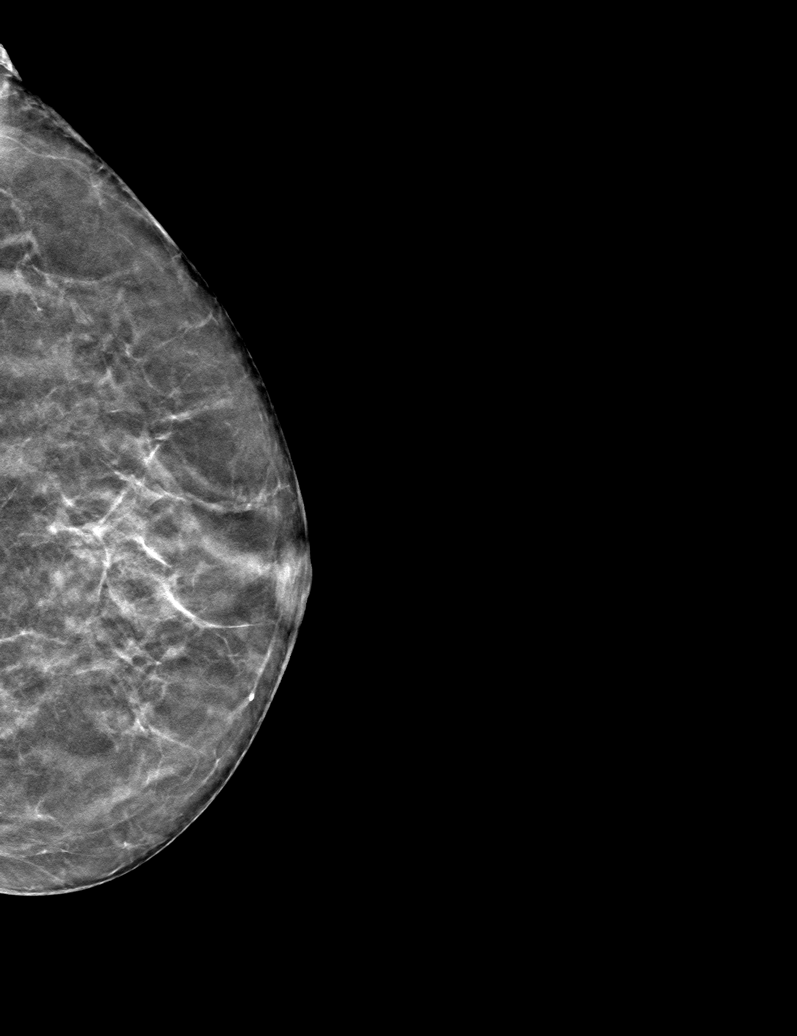

[6 of 30 positions shown; findings below may reference images not displayed]

ACR Breast Density Category b: There are scattered areas of
fibroglandular density.
FINDINGS: There are no findings suspicious for malignancy. Images were
processed with CAD.
IMPRESSION: No mammographic evidence of malignancy. A result letter of this
screening mammogram will be mailed directly to the patient.

RECOMMENDATION:
Screening mammogram in one year. (Code:[id number])

BI-RADS CATEGORY  1: Negative.

## 2021-02-28 DIAGNOSIS — H401132 Primary open-angle glaucoma, bilateral, moderate stage: Secondary | ICD-10-CM | POA: Diagnosis not present

## 2021-02-28 DIAGNOSIS — H01001 Unspecified blepharitis right upper eyelid: Secondary | ICD-10-CM | POA: Diagnosis not present

## 2021-02-28 DIAGNOSIS — H524 Presbyopia: Secondary | ICD-10-CM | POA: Diagnosis not present

## 2021-02-28 DIAGNOSIS — H53001 Unspecified amblyopia, right eye: Secondary | ICD-10-CM | POA: Diagnosis not present

## 2021-03-22 DIAGNOSIS — I129 Hypertensive chronic kidney disease with stage 1 through stage 4 chronic kidney disease, or unspecified chronic kidney disease: Secondary | ICD-10-CM | POA: Diagnosis not present

## 2021-03-22 DIAGNOSIS — E7800 Pure hypercholesterolemia, unspecified: Secondary | ICD-10-CM | POA: Diagnosis not present

## 2021-03-22 DIAGNOSIS — Z0000 Encounter for general adult medical examination without abnormal findings: Secondary | ICD-10-CM | POA: Diagnosis not present

## 2021-03-22 DIAGNOSIS — R110 Nausea: Secondary | ICD-10-CM | POA: Diagnosis not present

## 2021-04-09 DIAGNOSIS — R6883 Chills (without fever): Secondary | ICD-10-CM | POA: Diagnosis not present

## 2021-04-09 DIAGNOSIS — Z03818 Encounter for observation for suspected exposure to other biological agents ruled out: Secondary | ICD-10-CM | POA: Diagnosis not present

## 2021-04-09 DIAGNOSIS — R5383 Other fatigue: Secondary | ICD-10-CM | POA: Diagnosis not present

## 2021-04-09 DIAGNOSIS — R5381 Other malaise: Secondary | ICD-10-CM | POA: Diagnosis not present

## 2021-05-27 DIAGNOSIS — J019 Acute sinusitis, unspecified: Secondary | ICD-10-CM | POA: Diagnosis not present

## 2021-05-27 DIAGNOSIS — Z03818 Encounter for observation for suspected exposure to other biological agents ruled out: Secondary | ICD-10-CM | POA: Diagnosis not present

## 2021-05-27 DIAGNOSIS — R059 Cough, unspecified: Secondary | ICD-10-CM | POA: Diagnosis not present

## 2021-07-19 DIAGNOSIS — R197 Diarrhea, unspecified: Secondary | ICD-10-CM | POA: Diagnosis not present

## 2021-07-20 DIAGNOSIS — R197 Diarrhea, unspecified: Secondary | ICD-10-CM | POA: Diagnosis not present

## 2021-08-31 DIAGNOSIS — H01004 Unspecified blepharitis left upper eyelid: Secondary | ICD-10-CM | POA: Diagnosis not present

## 2021-08-31 DIAGNOSIS — H01001 Unspecified blepharitis right upper eyelid: Secondary | ICD-10-CM | POA: Diagnosis not present

## 2021-08-31 DIAGNOSIS — H401122 Primary open-angle glaucoma, left eye, moderate stage: Secondary | ICD-10-CM | POA: Diagnosis not present

## 2021-09-05 ENCOUNTER — Other Ambulatory Visit: Payer: Self-pay | Admitting: Family Medicine

## 2021-09-05 DIAGNOSIS — Z1231 Encounter for screening mammogram for malignant neoplasm of breast: Secondary | ICD-10-CM

## 2021-09-26 ENCOUNTER — Ambulatory Visit: Payer: Medicare Other

## 2021-09-28 ENCOUNTER — Ambulatory Visit
Admission: RE | Admit: 2021-09-28 | Discharge: 2021-09-28 | Disposition: A | Discharge status: Home / Self Care | Payer: Medicare Other | Source: Ambulatory Visit | Attending: Family Medicine | Admitting: Family Medicine

## 2021-09-28 DIAGNOSIS — Z1231 Encounter for screening mammogram for malignant neoplasm of breast: Secondary | ICD-10-CM

## 2021-12-06 DIAGNOSIS — H6123 Impacted cerumen, bilateral: Secondary | ICD-10-CM | POA: Diagnosis not present

## 2022-01-09 DIAGNOSIS — S81811A Laceration without foreign body, right lower leg, initial encounter: Secondary | ICD-10-CM | POA: Diagnosis not present

## 2022-01-26 DIAGNOSIS — S81811D Laceration without foreign body, right lower leg, subsequent encounter: Secondary | ICD-10-CM | POA: Diagnosis not present

## 2022-03-23 DIAGNOSIS — Z0000 Encounter for general adult medical examination without abnormal findings: Secondary | ICD-10-CM | POA: Diagnosis not present

## 2022-03-24 DIAGNOSIS — I129 Hypertensive chronic kidney disease with stage 1 through stage 4 chronic kidney disease, or unspecified chronic kidney disease: Secondary | ICD-10-CM | POA: Diagnosis not present

## 2022-03-24 DIAGNOSIS — M8588 Other specified disorders of bone density and structure, other site: Secondary | ICD-10-CM | POA: Diagnosis not present

## 2022-03-24 DIAGNOSIS — N1831 Chronic kidney disease, stage 3a: Secondary | ICD-10-CM | POA: Diagnosis not present

## 2022-03-24 DIAGNOSIS — E7800 Pure hypercholesterolemia, unspecified: Secondary | ICD-10-CM | POA: Diagnosis not present

## 2022-03-27 ENCOUNTER — Other Ambulatory Visit: Payer: Self-pay | Admitting: Family Medicine

## 2022-03-27 DIAGNOSIS — M8580 Other specified disorders of bone density and structure, unspecified site: Secondary | ICD-10-CM

## 2022-04-05 DIAGNOSIS — H401111 Primary open-angle glaucoma, right eye, mild stage: Secondary | ICD-10-CM | POA: Diagnosis not present

## 2022-04-05 DIAGNOSIS — H52203 Unspecified astigmatism, bilateral: Secondary | ICD-10-CM | POA: Diagnosis not present

## 2022-04-05 DIAGNOSIS — H53001 Unspecified amblyopia, right eye: Secondary | ICD-10-CM | POA: Diagnosis not present

## 2022-04-05 DIAGNOSIS — H401122 Primary open-angle glaucoma, left eye, moderate stage: Secondary | ICD-10-CM | POA: Diagnosis not present

## 2022-04-05 DIAGNOSIS — Z961 Presence of intraocular lens: Secondary | ICD-10-CM | POA: Diagnosis not present

## 2022-04-18 DIAGNOSIS — R3915 Urgency of urination: Secondary | ICD-10-CM | POA: Diagnosis not present

## 2022-04-18 DIAGNOSIS — K589 Irritable bowel syndrome without diarrhea: Secondary | ICD-10-CM | POA: Diagnosis not present

## 2022-04-18 DIAGNOSIS — R143 Flatulence: Secondary | ICD-10-CM | POA: Diagnosis not present

## 2022-05-03 DIAGNOSIS — H401122 Primary open-angle glaucoma, left eye, moderate stage: Secondary | ICD-10-CM | POA: Diagnosis not present

## 2022-05-10 DIAGNOSIS — H401111 Primary open-angle glaucoma, right eye, mild stage: Secondary | ICD-10-CM | POA: Diagnosis not present

## 2022-05-26 DIAGNOSIS — L820 Inflamed seborrheic keratosis: Secondary | ICD-10-CM | POA: Diagnosis not present

## 2022-06-26 DIAGNOSIS — H401122 Primary open-angle glaucoma, left eye, moderate stage: Secondary | ICD-10-CM | POA: Diagnosis not present

## 2022-06-26 DIAGNOSIS — H0100A Unspecified blepharitis right eye, upper and lower eyelids: Secondary | ICD-10-CM | POA: Diagnosis not present

## 2022-06-26 DIAGNOSIS — H0100B Unspecified blepharitis left eye, upper and lower eyelids: Secondary | ICD-10-CM | POA: Diagnosis not present

## 2022-06-26 DIAGNOSIS — H401111 Primary open-angle glaucoma, right eye, mild stage: Secondary | ICD-10-CM | POA: Diagnosis not present

## 2022-07-28 DIAGNOSIS — U071 COVID-19: Secondary | ICD-10-CM | POA: Diagnosis not present

## 2022-08-15 DIAGNOSIS — H04221 Epiphora due to insufficient drainage, right lacrimal gland: Secondary | ICD-10-CM | POA: Diagnosis not present

## 2022-08-15 DIAGNOSIS — H01005 Unspecified blepharitis left lower eyelid: Secondary | ICD-10-CM | POA: Diagnosis not present

## 2022-08-15 DIAGNOSIS — H01002 Unspecified blepharitis right lower eyelid: Secondary | ICD-10-CM | POA: Diagnosis not present

## 2022-08-21 DIAGNOSIS — H938X3 Other specified disorders of ear, bilateral: Secondary | ICD-10-CM | POA: Diagnosis not present

## 2022-08-21 DIAGNOSIS — H9313 Tinnitus, bilateral: Secondary | ICD-10-CM | POA: Diagnosis not present

## 2022-08-21 DIAGNOSIS — R1310 Dysphagia, unspecified: Secondary | ICD-10-CM | POA: Diagnosis not present

## 2022-09-05 ENCOUNTER — Other Ambulatory Visit: Payer: Self-pay | Admitting: Family Medicine

## 2022-09-05 DIAGNOSIS — Z0000 Encounter for general adult medical examination without abnormal findings: Secondary | ICD-10-CM

## 2022-09-12 ENCOUNTER — Ambulatory Visit
Admission: RE | Admit: 2022-09-12 | Discharge: 2022-09-12 | Disposition: A | Discharge status: Home / Self Care | Payer: Medicare Other | Source: Ambulatory Visit | Attending: Family Medicine | Admitting: Family Medicine

## 2022-09-12 DIAGNOSIS — E349 Endocrine disorder, unspecified: Secondary | ICD-10-CM | POA: Diagnosis not present

## 2022-09-12 DIAGNOSIS — M8580 Other specified disorders of bone density and structure, unspecified site: Secondary | ICD-10-CM

## 2022-10-02 ENCOUNTER — Ambulatory Visit
Admission: RE | Admit: 2022-10-02 | Discharge: 2022-10-02 | Disposition: A | Discharge status: Home / Self Care | Payer: Medicare Other | Source: Ambulatory Visit | Attending: Family Medicine | Admitting: Family Medicine

## 2022-10-02 DIAGNOSIS — Z1231 Encounter for screening mammogram for malignant neoplasm of breast: Secondary | ICD-10-CM | POA: Diagnosis not present

## 2022-10-02 DIAGNOSIS — Z0000 Encounter for general adult medical examination without abnormal findings: Secondary | ICD-10-CM

## 2022-11-10 DIAGNOSIS — B351 Tinea unguium: Secondary | ICD-10-CM | POA: Diagnosis not present

## 2022-11-22 ENCOUNTER — Ambulatory Visit: Payer: Medicare Other | Admitting: Podiatry

## 2022-11-22 ENCOUNTER — Encounter: Payer: Self-pay | Admitting: Podiatry

## 2022-11-22 DIAGNOSIS — L600 Ingrowing nail: Secondary | ICD-10-CM | POA: Diagnosis not present

## 2022-11-22 DIAGNOSIS — L603 Nail dystrophy: Secondary | ICD-10-CM | POA: Diagnosis not present

## 2022-11-22 DIAGNOSIS — L608 Other nail disorders: Secondary | ICD-10-CM | POA: Diagnosis not present

## 2022-11-22 NOTE — Addendum Note (Signed)
Addended byMaury Dus, Muntaha Vermette L on: 11/22/2022 04:49 PM   Modules accepted: Orders

## 2022-11-22 NOTE — Progress Notes (Signed)
  Subjective:  Patient ID: Melinda Walker, female    DOB: [birth year],   MRN: [id number]  Chief Complaint  Patient presents with   Nail Problem    np right great toe pain possible ingrown/ nail fungus as well,using terbinafine drops prescribed by dermatologist but has not used yet.    88 y.o. female presents for concern of thickened dystrophic nails as well as right ingrown nail possible. Relates she was seen by dermatologist and given terbinafine drops but has not used yet. Relates the great toe has been sore although it has been improving. Recently cut into the corner.  . Denies any other pedal complaints. Denies n/v/f/c.   Past Medical History:  Diagnosis Date   Anxiety    Arthritis    High cholesterol    Hypertension     Objective:  Physical Exam: Vascular: DP/PT pulses 2/4 bilateral. CFT <3 seconds. Normal hair growth on digits. No edema.  Skin. No lacerations or abrasions bilateral feet. Right foot nails 1-5 are thickened elongated and dystrophic and right hallux nail with mild medial incurvation and some tenderness to palpation. No erythema edema or purulence noted.  Musculoskeletal: MMT 5/5 bilateral lower extremities in DF, PF, Inversion and Eversion. Deceased ROM in DF of ankle joint.  Neurological: Sensation intact to light touch.   Assessment:   1. Onychodystrophy   2. Ingrown right greater toenail      Plan:  Patient was evaluated and treated and all questions answered. -Examined patient -Discussed treatment options for painful dystrophic nails  -Right hallux nail debrided to comfort and discussed dental floss and soaks and how to cut to prevent ingrown. Discussed if worsens would consider ingrown nail procedure in future and did discuss procedure course.  -Clinical picture and Fungal culture was obtained by removing a portion of the hard nail itself from each of the involved toenails using a sterile nail nipper and sent to [facility] lab. Patient tolerated the biopsy  procedure well without discomfort or need for anesthesia.  -Discussed fungal nail treatment options including oral, topical, and laser treatments.  -Patient to return in 4 weeks for follow up evaluation and discussion of fungal culture results or sooner if symptoms worsen.   Louann Sjogren, DPM

## 2022-12-25 ENCOUNTER — Ambulatory Visit: Payer: Medicare Other | Admitting: Podiatry

## 2022-12-25 DIAGNOSIS — L603 Nail dystrophy: Secondary | ICD-10-CM

## 2022-12-25 NOTE — Progress Notes (Signed)
Subjective:  Patient ID: Melinda Walker, female    DOB: 1934/10/27,   MRN: [id number]  Chief Complaint  Patient presents with   Nail Problem    NAIL FUNGUS FU, THINKS IT IS DOING BETTER    88 y.o. female presents for follow-up of nail changes and to discus culture results.  . Denies any other pedal complaints. Denies n/v/f/c.   Past Medical History:  Diagnosis Date   Anxiety    Arthritis    High cholesterol    Hypertension     Objective:  Physical Exam: Vascular: DP/PT pulses 2/4 bilateral. CFT <3 seconds. Normal hair growth on digits. No edema.  Skin. No lacerations or abrasions bilateral feet. Right foot nails 1-5 are thickened elongated and dystrophic and right hallux nail with mild medial incurvation and some tenderness to palpation. No erythema edema or purulence noted.  Musculoskeletal: MMT 5/5 bilateral lower extremities in DF, PF, Inversion and Eversion. Deceased ROM in DF of ankle joint.  Neurological: Sensation intact to light touch.   Assessment:   1. Onychodystrophy       Plan:  Patient was evaluated and treated and all questions answered. -Examined patient -Discussed treatment options for painful dystrophic nails  -Right hallux nail debrided to comfort and discussed dental floss and soaks and how to cut to prevent ingrown. Discussed if worsens would consider ingrown nail procedure in future and did discuss procedure course.  -Culture negative for any fungus.  -Discussed options including urea.  Follow-up as needed.    Louann Sjogren, DPM

## 2022-12-26 DIAGNOSIS — H401111 Primary open-angle glaucoma, right eye, mild stage: Secondary | ICD-10-CM | POA: Diagnosis not present

## 2022-12-26 DIAGNOSIS — H401122 Primary open-angle glaucoma, left eye, moderate stage: Secondary | ICD-10-CM | POA: Diagnosis not present

## 2022-12-26 DIAGNOSIS — T1501XA Foreign body in cornea, right eye, initial encounter: Secondary | ICD-10-CM | POA: Diagnosis not present

## 2022-12-26 DIAGNOSIS — Z961 Presence of intraocular lens: Secondary | ICD-10-CM | POA: Diagnosis not present

## 2023-01-25 DIAGNOSIS — R631 Polydipsia: Secondary | ICD-10-CM | POA: Diagnosis not present

## 2023-03-06 DIAGNOSIS — H0100A Unspecified blepharitis right eye, upper and lower eyelids: Secondary | ICD-10-CM | POA: Diagnosis not present

## 2023-03-06 DIAGNOSIS — H0100B Unspecified blepharitis left eye, upper and lower eyelids: Secondary | ICD-10-CM | POA: Diagnosis not present

## 2023-03-26 DIAGNOSIS — Z0000 Encounter for general adult medical examination without abnormal findings: Secondary | ICD-10-CM | POA: Diagnosis not present

## 2023-03-28 DIAGNOSIS — I10 Essential (primary) hypertension: Secondary | ICD-10-CM | POA: Diagnosis not present

## 2023-03-28 DIAGNOSIS — M8588 Other specified disorders of bone density and structure, other site: Secondary | ICD-10-CM | POA: Diagnosis not present

## 2023-03-28 DIAGNOSIS — Z681 Body mass index (BMI) 19 or less, adult: Secondary | ICD-10-CM | POA: Diagnosis not present

## 2023-03-28 DIAGNOSIS — R110 Nausea: Secondary | ICD-10-CM | POA: Diagnosis not present

## 2023-03-28 DIAGNOSIS — E7800 Pure hypercholesterolemia, unspecified: Secondary | ICD-10-CM | POA: Diagnosis not present

## 2023-05-21 DIAGNOSIS — Z681 Body mass index (BMI) 19 or less, adult: Secondary | ICD-10-CM | POA: Diagnosis not present

## 2023-07-03 DIAGNOSIS — H5201 Hypermetropia, right eye: Secondary | ICD-10-CM | POA: Diagnosis not present

## 2023-07-03 DIAGNOSIS — H401132 Primary open-angle glaucoma, bilateral, moderate stage: Secondary | ICD-10-CM | POA: Diagnosis not present

## 2023-07-03 DIAGNOSIS — H01004 Unspecified blepharitis left upper eyelid: Secondary | ICD-10-CM | POA: Diagnosis not present

## 2023-07-03 DIAGNOSIS — H01001 Unspecified blepharitis right upper eyelid: Secondary | ICD-10-CM | POA: Diagnosis not present

## 2023-07-03 DIAGNOSIS — Z961 Presence of intraocular lens: Secondary | ICD-10-CM | POA: Diagnosis not present

## 2023-07-03 DIAGNOSIS — H5212 Myopia, left eye: Secondary | ICD-10-CM | POA: Diagnosis not present

## 2023-10-19 ENCOUNTER — Other Ambulatory Visit: Payer: Self-pay | Admitting: Family Medicine

## 2023-10-19 DIAGNOSIS — Z1231 Encounter for screening mammogram for malignant neoplasm of breast: Secondary | ICD-10-CM

## 2023-11-06 ENCOUNTER — Ambulatory Visit
Admission: RE | Admit: 2023-11-06 | Discharge: 2023-11-06 | Disposition: A | Discharge status: Home / Self Care | Source: Ambulatory Visit | Attending: Family Medicine | Admitting: Family Medicine

## 2023-11-06 DIAGNOSIS — Z1231 Encounter for screening mammogram for malignant neoplasm of breast: Secondary | ICD-10-CM

## 2023-11-07 DIAGNOSIS — H0100A Unspecified blepharitis right eye, upper and lower eyelids: Secondary | ICD-10-CM | POA: Diagnosis not present

## 2023-11-07 DIAGNOSIS — H0100B Unspecified blepharitis left eye, upper and lower eyelids: Secondary | ICD-10-CM | POA: Diagnosis not present

## 2023-12-11 DIAGNOSIS — H0100A Unspecified blepharitis right eye, upper and lower eyelids: Secondary | ICD-10-CM | POA: Diagnosis not present

## 2023-12-11 DIAGNOSIS — H0100B Unspecified blepharitis left eye, upper and lower eyelids: Secondary | ICD-10-CM | POA: Diagnosis not present

## 2024-01-03 DIAGNOSIS — L821 Other seborrheic keratosis: Secondary | ICD-10-CM | POA: Diagnosis not present

## 2024-01-03 DIAGNOSIS — L820 Inflamed seborrheic keratosis: Secondary | ICD-10-CM | POA: Diagnosis not present

## 2024-01-03 DIAGNOSIS — D225 Melanocytic nevi of trunk: Secondary | ICD-10-CM | POA: Diagnosis not present

## 2024-01-04 DIAGNOSIS — H401132 Primary open-angle glaucoma, bilateral, moderate stage: Secondary | ICD-10-CM | POA: Diagnosis not present

## 2024-01-04 DIAGNOSIS — H01005 Unspecified blepharitis left lower eyelid: Secondary | ICD-10-CM | POA: Diagnosis not present

## 2024-01-04 DIAGNOSIS — H01002 Unspecified blepharitis right lower eyelid: Secondary | ICD-10-CM | POA: Diagnosis not present

## 2024-01-30 ENCOUNTER — Emergency Department (HOSPITAL_COMMUNITY)
Admission: EM | Admit: 2024-01-30 | Discharge: 2024-01-30 | Disposition: A | Discharge status: Home / Self Care | Source: Ambulatory Visit | Attending: Emergency Medicine | Admitting: Emergency Medicine

## 2024-01-30 ENCOUNTER — Encounter (HOSPITAL_COMMUNITY): Payer: Self-pay

## 2024-01-30 ENCOUNTER — Other Ambulatory Visit: Payer: Self-pay

## 2024-01-30 DIAGNOSIS — Z79899 Other long term (current) drug therapy: Secondary | ICD-10-CM | POA: Diagnosis not present

## 2024-01-30 DIAGNOSIS — I10 Essential (primary) hypertension: Secondary | ICD-10-CM | POA: Diagnosis present

## 2024-01-30 LAB — CBC
HCT: 39.5 % (ref 36.0–46.0)
Hemoglobin: 12.9 g/dL (ref 12.0–15.0)
MCH: 32.5 pg (ref 26.0–34.0)
MCHC: 32.7 g/dL (ref 30.0–36.0)
MCV: 99.5 fL (ref 80.0–100.0)
Platelets: 251 K/uL (ref 150–400)
RBC: 3.97 MIL/uL (ref 3.87–5.11)
RDW: 12.2 % (ref 11.5–15.5)
WBC: 6.3 K/uL (ref 4.0–10.5)
nRBC: 0 % (ref 0.0–0.2)

## 2024-01-30 LAB — BASIC METABOLIC PANEL WITH GFR
Anion gap: 9 (ref 5–15)
BUN: 22 mg/dL (ref 8–23)
CO2: 29 mmol/L (ref 22–32)
Calcium: 10.1 mg/dL (ref 8.9–10.3)
Chloride: 103 mmol/L (ref 98–111)
Creatinine, Ser: 0.89 mg/dL (ref 0.44–1.00)
GFR, Estimated: >60 mL/min (ref >60)
Glucose, Bld: 81 mg/dL (ref 70–99)
Potassium: 4.2 mmol/L (ref 3.5–5.1)
Sodium: 141 mmol/L (ref 135–145)

## 2024-01-30 MED ORDER — LISINOPRIL 10 MG PO TABS
10.0000 mg | ORAL_TABLET | Freq: Once | ORAL | Status: AC
  Administered 2024-01-30: 10 mg via ORAL
  Filled 2024-01-30: qty 1

## 2024-01-30 NOTE — ED Notes (Signed)
 ..  The patient is A&OX4, ambulatory at d/c with independent steady gait, NAD. Pt verbalized understanding of d/c instructions and follow up care.

## 2024-01-30 NOTE — ED Provider Notes (Signed)
 [place] EMERGENCY DEPARTMENT AT [facility] Provider Note  CSN: [id number] Arrival date & time: 01/30/24 1713  Chief Complaint(s) Hypertension  HPI Melinda Walker is a 89 y.o. female here today for hypertension.  She was at an ENT clinic having her ears cleaned, was told that she was hypertensive, began to develop headache.  She reports that about a week ago she had some trouble with her gait but that had resolved.  She denies any pain in her chest or shortness of breath.  She denies headache to me which is a deviation from, nursing note which I clarified with the patient.   Past Medical History Past Medical History:  Diagnosis Date   Anxiety    Arthritis    High cholesterol    Hypertension    There are no active problems to display for this patient.  Home Medication(s) Prior to Admission medications   Medication Sig Start Date End Date Taking? Authorizing Provider  atorvastatin (LIPITOR) 40 MG tablet Take 40 mg by mouth daily.      [provider]  bacitracin ophthalmic ointment Place 1 application into both eyes daily as needed for wound care. apply to eye    [provider]  Calcium Carbonate-Vitamin D (CALCIUM + D) 600-200 MG-UNIT TABS Take 1 tablet by mouth daily.      [provider]  carboxymethylcellulose (REFRESH PLUS) 0.5 % SOLN Place 1 drop into both eyes 3 (three) times daily as needed (dry eyes).    [provider]  cholecalciferol (VITAMIN D) 1000 UNITS tablet Take 1,000 Units by mouth daily.    [provider]  Eyelid Cleansers (OCUSOFT LID SCRUB) PADS Apply 1 each topically every other day.    [provider]  hydrOXYzine (ATARAX/VISTARIL) 25 MG tablet Take 12.5 mg by mouth daily as needed for anxiety. Reported on 08/04/2015    [provider]  lisinopril (PRINIVIL,ZESTRIL) 10 MG tablet Take 10 mg by mouth every morning.     [provider]  loperamide (IMODIUM A-D) 2 MG tablet Take  2 mg by mouth 3 (three) times daily as needed. Reported on 08/04/2015    [provider]  Multiple Vitamins-Minerals (MULTIVITAMIN WITH MINERALS) tablet Take 1 tablet by mouth daily.      [provider]  promethazine  (PHENERGAN ) 25 MG tablet Take 12.5 mg by mouth 2 (two) times daily as needed. Reported on 08/04/2015    [provider]  UNABLE TO FIND Apply 1 drop to affected nails at bedtime as needed 11/10/22   [provider]                                                                                                                                    Past Surgical History Past Surgical History:  Procedure Laterality Date   ABDOMINAL HYSTERECTOMY     CATARACT EXTRACTION, BILATERAL Bilateral    CERVICAL FUSION  2005   hardware retained. positioning issues.   COLONOSCOPY WITH PROPOFOL  N/A 10/20/2013   Procedure: COLONOSCOPY WITH PROPOFOL ;  Surgeon: Gladis MARLA Louder, MD;  Location: [facility];  Service: Endoscopy;  Laterality: N/A;   DIAGNOSTIC LAPAROSCOPY     x2 ectopic pregnancies   FINGER ARTHRODESIS  04/25/2011   Procedure: ARTHRODESIS FINGER;  Surgeon: Lamar LULLA Leonor Mickey., MD;  Location: [facility];  Service: Orthopedics;  Laterality: Right;  Fusion right index distal interphalangeal   TONSILLECTOMY     x2   Family History Family History  Problem Relation Age of Onset   Breast cancer Maternal Aunt    Breast cancer Cousin     Social History Social History   Tobacco Use   Smoking status: Never   Smokeless tobacco: Never  Substance Use Topics   Alcohol use: Yes    Alcohol/week: 2.0 standard drinks of alcohol    Types: 2 Glasses of wine per week   Drug use: No   Allergies Ciprofloxacin, Codeine, and Sulfa antibiotics  Review of Systems Review of Systems  Physical Exam Vital Signs  I have reviewed the triage vital signs BP (!) 174/89 (BP Location: Left Arm)   Pulse (!) 56   Temp (!) 97.5 F (36.4 C) (Oral)   Resp 20    SpO2 100%   Physical Exam Vitals and nursing note reviewed.  Constitutional:      Appearance: She is not toxic-appearing.  HENT:     Head: Normocephalic and atraumatic.  Cardiovascular:     Rate and Rhythm: Normal rate.  Pulmonary:     Effort: Pulmonary effort is normal.  Abdominal:     General: Abdomen is flat.     Palpations: Abdomen is soft.  Musculoskeletal:        General: Normal range of motion.     Cervical back: Normal range of motion.  Skin:    General: Skin is warm.  Neurological:     General: No focal deficit present.     Mental Status: She is alert.     Cranial Nerves: No cranial nerve deficit.     Motor: No weakness.     Gait: Gait normal.     Comments: Normal gait     ED Results and Treatments Labs (all labs ordered are listed, but only abnormal results are displayed) Labs Reviewed  CBC  BASIC METABOLIC PANEL WITH GFR                                                                                                                          Radiology No results found.  Pertinent labs & imaging results that were available during my care of the patient were reviewed by me and considered in my medical decision making (see MDM for details).  Medications Ordered in ED Medications  lisinopril (ZESTRIL) tablet 10 mg (10 mg Oral Given 01/30/24 2109)  Procedures Procedures  (including critical care time)  Medical Decision Making / ED Course   This patient presents to the ED for concern of hypertension, this involves an extensive number of treatment options, and is a complaint that carries with it a high risk of complications and morbidity.  The differential diagnosis includes hypertension, less likely hypertensive urgency LOC, less likely CVA, less likely endorgan dysfunction.  MDM: On exam, patient overall well-appearing.   She denies having any symptoms prior to being told that her blood pressure was high, then began to feel nervous.  She states that she had some issues with her gait last week, however shift that was assessed in the emergency room today and her gait was fantastic.  No difficulty with walking.  Patient needs to go up on her lisinopril, currently only taking 10 mg.  Provided her with an additional dose here in the ED.  She is going to maintain a blood pressure log and follow-up with her primary care doctor.  Patient's EKG shows no ST segment depression or elevation, no T wave inversions, no evidence of acute ischemia.  Her renal function is normal, no anemia.   Additional history obtained: -Additional history obtained from family at bedside -External records from outside source obtained and reviewed including: Chart review including previous notes, labs, imaging, consultation notes   Lab Tests: -I ordered, reviewed, and interpreted labs.   The pertinent results include:   Labs Reviewed  CBC  BASIC METABOLIC PANEL WITH GFR      EKG   EKG Interpretation Date/Time:  Wednesday January 30 2024 20:50:49 EST Ventricular Rate:  56 PR Interval:  162 QRS Duration:  91 QT Interval:  409 QTC Calculation: 395 R Axis:   68  Text Interpretation: Sinus rhythm Borderline low voltage, extremity leads Confirmed by Mannie Pac [phone]) on 01/30/2024 9:02:29 PM         Medicines ordered and prescription drug management: Meds ordered this encounter  Medications   lisinopril (ZESTRIL) tablet 10 mg    -I have reviewed the patients home medicines and have made adjustments as needed   Cardiac Monitoring: The patient was maintained on a cardiac monitor.  I personally viewed and interpreted the cardiac monitored which showed an underlying rhythm of: Normal sinus rhythm  Social Determinants of Health:  Factors impacting patients care include: Lack of access to primary  care   Reevaluation: After the interventions noted above, I reevaluated the patient and found that they have :improved  Co morbidities that complicate the patient evaluation  Past Medical History:  Diagnosis Date   Anxiety    Arthritis    High cholesterol    Hypertension       Dispostion: I considered admission for this patient, however with her benign hypertension without endorgan dysfunction she is appropriate for discharge.     Final Clinical Impression(s) / ED Diagnoses Final diagnoses:  Primary hypertension     @PCDICTATION @    Mannie Pac T, DO 01/30/24 2221

## 2024-01-30 NOTE — ED Notes (Signed)
 Pt was able to ambulate around the nurses station with independent steady gait. Pt reported she felt fine. Dr. Mannie informed.

## 2024-01-30 NOTE — Discharge Instructions (Addendum)
 Starting tomorrow morning, begin taking 20 mg of lisinopril each day.  Please call your primary care doctor to let them know that you were seen in the emergency room for high blood pressure.  You can tell them that your workup was normal.  They may recommend other medications for you.  Return to emergency department if you develop trouble walking, headache, trouble with your vision or pain in your chest.

## 2024-01-30 NOTE — ED Triage Notes (Signed)
 Pt came in for high blood pressure after going to a clinic yesterday. Pt stated her pressures ran at 163/84 and 180/94. Pt also stated she was given a ECG which came back normal. Pt takes Lisinopril 10mg  at baseline. Pt c/o headache.

## 2024-03-25 ENCOUNTER — Encounter: Payer: Self-pay | Admitting: Podiatry

## 2024-03-25 ENCOUNTER — Ambulatory Visit: Admitting: Podiatry

## 2024-03-25 DIAGNOSIS — L600 Ingrowing nail: Secondary | ICD-10-CM

## 2024-03-25 NOTE — Progress Notes (Signed)
"  °  Subjective:  Patient ID: Melinda Walker, female    DOB: Jan 04, 1935,   MRN: [id number]  Chief Complaint  Patient presents with   Nail Problem    I've got a sore toe.  I tried the dental floss thing and I couldn't get it to go under there.  (Right hallux medial border)    89 y.o. female presents for concern of right great toenail that has been sore recently.  She relates she was doing fine since last seen however she started getting pedicures and noticed after pedicure she started to have some soreness on the inside border of her right great toe.  She has been keeping Neosporin and a Band-Aid on the area denies any other pedal complaints. Denies n/v/f/c.   Past Medical History:  Diagnosis Date   Anxiety    Arthritis    High cholesterol    Hypertension     Objective:  Physical Exam: Vascular: DP/PT pulses 2/4 bilateral. CFT <3 seconds. Normal hair growth on digits. No edema.  Skin. No lacerations or abrasions bilateral feet. Right foot nails 1-5 are thickened elongated and dystrophic and right hallux nail with mild medial incurvation and some tenderness to palpation. No erythema edema or purulence noted.  Musculoskeletal: MMT 5/5 bilateral lower extremities in DF, PF, Inversion and Eversion. Deceased ROM in DF of ankle joint.  Neurological: Sensation intact to light touch.   Assessment:   1. Ingrown right greater toenail        Plan:  Patient was evaluated and treated and all questions answered. -Examined patient -Discussed treatment options for painful dystrophic nails  -Right hallux nail debrided to comfort and discussed dental floss and soaks and how to cut to prevent ingrown. Discussed if worsens would consider ingrown nail procedure in future and did discuss procedure course.  - Discussed continuing to soak and use Neosporin and a Band-Aid as needed.  This should improve.  Advised on not letting them get too aggressive with pedicures. Follow-up as needed.    Asberry Failing, DPM    "
